# Patient Record
Sex: Female | Born: 2000 | Race: Black or African American | Hispanic: No | Marital: Single | Smoking: Never smoker
Health system: Southern US, Community
[De-identification: ages and names within clinical notes are randomized; demographics above are authoritative.]

## PROBLEM LIST (undated history)

## (undated) DIAGNOSIS — J45909 Unspecified asthma, uncomplicated: Secondary | ICD-10-CM

## (undated) DIAGNOSIS — A64 Unspecified sexually transmitted disease: Secondary | ICD-10-CM

## (undated) DIAGNOSIS — Z5189 Encounter for other specified aftercare: Secondary | ICD-10-CM

## (undated) HISTORY — DX: Encounter for other specified aftercare: Z51.89

## (undated) HISTORY — PX: NO PAST SURGERIES: SHX2092

## (undated) HISTORY — DX: Unspecified sexually transmitted disease: A64

---
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
## 2018-05-11 ENCOUNTER — Other Ambulatory Visit: Payer: Self-pay | Admitting: Physician Assistant

## 2018-05-11 DIAGNOSIS — N644 Mastodynia: Secondary | ICD-10-CM

## 2018-05-23 ENCOUNTER — Other Ambulatory Visit: Payer: Self-pay

## 2018-08-13 ENCOUNTER — Other Ambulatory Visit: Payer: Self-pay

## 2018-08-13 ENCOUNTER — Emergency Department (HOSPITAL_COMMUNITY): Payer: Self-pay

## 2018-08-13 ENCOUNTER — Emergency Department (HOSPITAL_COMMUNITY)
Admission: EM | Admit: 2018-08-13 | Discharge: 2018-08-13 | Disposition: A | Discharge status: Home / Self Care | Payer: Self-pay | Attending: Emergency Medicine | Admitting: Emergency Medicine

## 2018-08-13 ENCOUNTER — Encounter (HOSPITAL_COMMUNITY): Payer: Self-pay

## 2018-08-13 DIAGNOSIS — R079 Chest pain, unspecified: Secondary | ICD-10-CM | POA: Insufficient documentation

## 2018-08-13 DIAGNOSIS — R1084 Generalized abdominal pain: Secondary | ICD-10-CM

## 2018-08-13 DIAGNOSIS — J45909 Unspecified asthma, uncomplicated: Secondary | ICD-10-CM | POA: Insufficient documentation

## 2018-08-13 LAB — URINALYSIS, ROUTINE W REFLEX MICROSCOPIC
Bilirubin Urine: NEGATIVE
GLUCOSE, UA: NEGATIVE mg/dL
Hgb urine dipstick: NEGATIVE
Ketones, ur: 80 mg/dL — AB
LEUKOCYTE UA: NEGATIVE
Nitrite: NEGATIVE
PH: 5 (ref 5.0–8.0)
Protein, ur: NEGATIVE mg/dL
SPECIFIC GRAVITY, URINE: 1.027 (ref 1.005–1.030)

## 2018-08-13 LAB — COMPREHENSIVE METABOLIC PANEL
ALBUMIN: 3.9 g/dL (ref 3.5–5.0)
ALT: 12 U/L (ref 0–44)
AST: 19 U/L (ref 15–41)
Alkaline Phosphatase: 85 U/L (ref 38–126)
Anion gap: 12 (ref 5–15)
BUN: 6 mg/dL (ref 6–20)
CALCIUM: 9.3 mg/dL (ref 8.9–10.3)
CO2: 21 mmol/L — AB (ref 22–32)
CREATININE: 0.79 mg/dL (ref 0.44–1.00)
Chloride: 101 mmol/L (ref 98–111)
GFR calc Af Amer: >60 mL/min (ref >60)
GFR calc non Af Amer: >60 mL/min (ref >60)
GLUCOSE: 85 mg/dL (ref 70–99)
Potassium: 3.7 mmol/L (ref 3.5–5.1)
SODIUM: 134 mmol/L — AB (ref 135–145)
Total Bilirubin: 0.6 mg/dL (ref 0.3–1.2)
Total Protein: 7.7 g/dL (ref 6.5–8.1)

## 2018-08-13 LAB — CBC WITH DIFFERENTIAL/PLATELET
ABS IMMATURE GRANULOCYTES: 0.06 10*3/uL (ref 0.00–0.07)
BASOS ABS: 0.1 10*3/uL (ref 0.0–0.1)
Basophils Relative: 0 %
Eosinophils Absolute: 0 10*3/uL (ref 0.0–0.5)
Eosinophils Relative: 0 %
HEMATOCRIT: 39.1 % (ref 36.0–46.0)
Hemoglobin: 12.4 g/dL (ref 12.0–15.0)
Immature Granulocytes: 0 %
Lymphocytes Relative: 10 %
Lymphs Abs: 1.5 10*3/uL (ref 0.7–4.0)
MCH: 22.9 pg — ABNORMAL LOW (ref 26.0–34.0)
MCHC: 31.7 g/dL (ref 30.0–36.0)
MCV: 72.1 fL — ABNORMAL LOW (ref 80.0–100.0)
Monocytes Absolute: 1.6 10*3/uL — ABNORMAL HIGH (ref 0.1–1.0)
Monocytes Relative: 11 %
NEUTROS PCT: 79 %
Neutro Abs: 11.8 10*3/uL — ABNORMAL HIGH (ref 1.7–7.7)
Platelets: 311 10*3/uL (ref 150–400)
RBC: 5.42 MIL/uL — AB (ref 3.87–5.11)
RDW: 17.6 % — ABNORMAL HIGH (ref 11.5–15.5)
WBC: 15.1 10*3/uL — ABNORMAL HIGH (ref 4.0–10.5)
nRBC: 0 % (ref 0.0–0.2)

## 2018-08-13 LAB — I-STAT BETA HCG BLOOD, ED (MC, WL, AP ONLY): I-stat hCG, quantitative: <5 m[IU]/mL (ref <5)

## 2018-08-13 LAB — LIPASE, BLOOD: Lipase: 43 U/L (ref 11–51)

## 2018-08-13 MED ORDER — ONDANSETRON 4 MG PO TBDP: 4.0000 mg | ORAL_TABLET | Freq: Three times a day (TID) | ORAL | 0 refills | Status: DC | PRN

## 2018-08-13 MED ORDER — MORPHINE SULFATE (PF) 4 MG/ML IV SOLN
4.0000 mg | Freq: Once | INTRAVENOUS | Status: AC
  Administered 2018-08-13: 4 mg via INTRAVENOUS
  Filled 2018-08-13: qty 1

## 2018-08-13 MED ORDER — SODIUM CHLORIDE 0.9 % IV BOLUS
1000.0000 mL | Freq: Once | INTRAVENOUS | Status: AC
  Administered 2018-08-13: 1000 mL via INTRAVENOUS

## 2018-08-13 MED ORDER — IOHEXOL 300 MG/ML  SOLN
100.0000 mL | Freq: Once | INTRAMUSCULAR | Status: AC | PRN
  Administered 2018-08-13: 100 mL via INTRAVENOUS

## 2018-08-13 MED ORDER — HYDROMORPHONE HCL 1 MG/ML IJ SOLN
0.5000 mg | Freq: Once | INTRAMUSCULAR | Status: AC
  Administered 2018-08-13: 0.5 mg via INTRAVENOUS
  Filled 2018-08-13: qty 1

## 2018-08-13 MED ORDER — OXYCODONE-ACETAMINOPHEN 5-325 MG PO TABS
1.0000 | ORAL_TABLET | Freq: Once | ORAL | Status: AC
  Administered 2018-08-13: 1 via ORAL
  Filled 2018-08-13: qty 1

## 2018-08-13 MED ORDER — DICYCLOMINE HCL 20 MG PO TABS: 20.0000 mg | ORAL_TABLET | Freq: Two times a day (BID) | ORAL | 0 refills | Status: DC

## 2018-08-13 MED ORDER — ONDANSETRON HCL 4 MG/2ML IJ SOLN
4.0000 mg | Freq: Once | INTRAMUSCULAR | Status: AC
  Administered 2018-08-13: 4 mg via INTRAVENOUS
  Filled 2018-08-13: qty 2

## 2018-08-13 NOTE — ED Notes (Signed)
Pt given water for fluid challenge 

## 2018-08-13 NOTE — ED Triage Notes (Signed)
Pt arrived with c/o abd pn x couple days; pt stated that she is approaching menstrual cycle time but the cramping felt different. Pt states that he chest pain started yesterday, centralized and pn moves between abd and chest.

## 2018-08-13 NOTE — ED Provider Notes (Signed)
[facility] EMERGENCY DEPARTMENT Provider Note   CSN: [id number] Arrival date & time: 08/13/18  0608    History   Chief Complaint Chief Complaint  Patient presents with  . Abdominal Pain    x couple days  . Chest Pain    yesterday    HPI Shari Flores is a 18 y.o. female.     Patient with no past surgical history presents the emergency department with 3 days of abdominal pain.  Pain started in the lower abdomen and has now moved up to the upper abdomen including the chest.  Patient has had a mild sore throat with hot flashes and chills.  No fevers reported.  No runny nose or cough  She vomited once this morning when the pain was worse.  No diarrhea or urinary symptoms.  She states that she is anticipating her menstrual period starting soon.  No vaginal bleeding or discharge.  No significant shortness of breath.  No treatments prior to arrival.  No known COVID or sick contacts.   Shari Flores was evaluated in Emergency Department on 08/13/2018 for the symptoms described in the history of present illness. She was evaluated in the context of the global COVID-19 pandemic, which necessitated consideration that the patient might be at risk for infection with the SARS-CoV-2 virus that causes COVID-19. Institutional protocols and algorithms that pertain to the evaluation of patients at risk for COVID-19 are in a state of rapid change based on information released by regulatory bodies including the CDC and federal and state organizations. These policies and algorithms were followed during the patient's care in the ED.      Past Medical History:  Diagnosis Date  . Asthma     Patient Active Problem List   Diagnosis Date Noted  . Mild persistent chronic asthma without complication 04/30/2014    History reviewed. No pertinent surgical history.   OB History   No obstetric history on file.      Home Medications    Prior to Admission medications    Medication Sig Start Date End Date Taking? Authorizing Provider  loratadine (CLARITIN) 10 MG tablet Take 10 mg by mouth daily as needed for allergies.    [provider]  mometasone (ASMANEX) 220 MCG/INH inhaler Inhale 1 puff into the lungs at bedtime. 05/01/15   Nyoka Cowden, MD  PROAIR HFA 108 [phone] BASE) MCG/ACT inhaler As needed 03/28/14   [provider]    Family History Family History  Problem Relation Age of Onset  . Diabetes Maternal Grandfather     Social History Social History   Tobacco Use  . Smoking status: Never Smoker  Substance Use Topics  . Alcohol use: Not on file  . Drug use: No     Allergies   Mobic [meloxicam]   Review of Systems Review of Systems  Constitutional: Positive for chills. Negative for fever.  HENT: Positive for sore throat. Negative for rhinorrhea.   Eyes: Negative for redness.  Respiratory: Negative for cough and shortness of breath.   Cardiovascular: Negative for chest pain.  Gastrointestinal: Positive for abdominal pain, nausea and vomiting. Negative for diarrhea.  Genitourinary: Negative for dysuria.  Musculoskeletal: Negative for myalgias.  Skin: Negative for rash.  Neurological: Negative for headaches.     Physical Exam Updated Vital Signs BP 136/85   Pulse 95   Temp 98.7 F (37.1 C) (Oral)   Resp (!) 23   Ht 5\' 4"  (1.626 m)   Wt 81.6 kg  LMP 07/15/2018   SpO2 99%   BMI 30.90 kg/m   Physical Exam Vitals signs and nursing note reviewed.  Constitutional:      Appearance: She is well-developed.  HENT:     Head: Normocephalic and atraumatic.  Eyes:     General:        Right eye: No discharge.        Left eye: No discharge.     Conjunctiva/sclera: Conjunctivae normal.  Neck:     Musculoskeletal: Normal range of motion and neck supple.  Cardiovascular:     Rate and Rhythm: Normal rate and regular rhythm.     Heart sounds: Normal heart sounds.  Pulmonary:     Effort: Pulmonary effort is  normal.     Breath sounds: Normal breath sounds.  Abdominal:     Palpations: Abdomen is soft.     Tenderness: There is generalized abdominal tenderness.    Skin:    General: Skin is warm and dry.  Neurological:     Mental Status: She is alert.      ED Treatments / Results  Labs (all labs ordered are listed, but only abnormal results are displayed) Labs Reviewed  CBC WITH DIFFERENTIAL/PLATELET - Abnormal; Notable for the following components:      Result Value   WBC 15.1 (*)    RBC 5.42 (*)    MCV 72.1 (*)    MCH 22.9 (*)    RDW 17.6 (*)    Neutro Abs 11.8 (*)    Monocytes Absolute 1.6 (*)    All other components within normal limits  COMPREHENSIVE METABOLIC PANEL - Abnormal; Notable for the following components:   Sodium 134 (*)    CO2 21 (*)    All other components within normal limits  URINALYSIS, ROUTINE W REFLEX MICROSCOPIC - Abnormal; Notable for the following components:   APPearance HAZY (*)    Ketones, ur 80 (*)    All other components within normal limits  LIPASE, BLOOD  I-STAT BETA HCG BLOOD, ED ([facility], [facility], [facility] ONLY)    EKG None  Radiology Dg Chest 2 View  Result Date: 08/13/2018 CLINICAL DATA:  Abdominal pain.  Chest pain. EXAM: CHEST - 2 VIEW COMPARISON:  None. FINDINGS: Numerous leads and wires project over the chest. Midline trachea. Normal heart size and mediastinal contours. No pleural effusion or pneumothorax. Clear lungs. IMPRESSION: Normal chest. Electronically Signed   By: Jeronimo Greaves M.D.   On: 08/13/2018 07:17    Procedures Procedures (including critical care time)  Medications Ordered in ED Medications  HYDROmorphone (DILAUDID) injection 0.5 mg (has no administration in time range)  morphine 4 MG/ML injection 4 mg (4 mg Intravenous Given 08/13/18 0643)  ondansetron (ZOFRAN) injection 4 mg (4 mg Intravenous Given 08/13/18 6195)     Initial Impression / Assessment and Plan / ED Course  I have reviewed the triage vital signs and the nursing  notes.  Pertinent labs & imaging results that were available during my care of the patient were reviewed by me and considered in my medical decision making (see chart for details).        Patient seen and examined. Work-up initiated. Medications ordered.   Vital signs reviewed and are as follows: BP 136/85   Pulse 95   Temp 98.7 F (37.1 C) (Oral)   Resp (!) 23   Ht 5\' 4"  (1.626 m)   Wt 81.6 kg   LMP 07/15/2018   SpO2 99%   BMI 30.90 kg/m  7:21 AM CXR images personally reviewed.   8:13 AM lab work-up significant for leukocytosis.  Patient is not pregnant.  On reexam, she is curled up in bed and appears very uncomfortable.  Additional pain medication ordered.  Given elevated white blood cell count and abdominal pain, will proceed with CT imaging of the abdomen to rule out appendicitis or other abdominal infection.  11:20 AM CT images personally reviewed earlier.   These were negative. Pt updated on all results.  Her pain is better controlled.  We will give a liter of normal saline and have patient drink fluids.  She has tolerated p.o. challenge, appears well.  On reexam, abdomen minimally generally tender.  We will discharged home with Bentyl and Zofran.  Encouraged OTC meds for sore throat and for abdominal pain.  The patient was urged to return to the Emergency Department immediately with worsening of current symptoms, worsening abdominal pain, persistent vomiting, blood noted in stools, fever, or any other concerns. The patient verbalized understanding.    Final Clinical Impressions(s) / ED Diagnoses   Final diagnoses:  Generalized abdominal pain   Patient with abdominal pain. Neg pregnancy. Vitals are stable, no fever. Labs with elevated WBC, otherwise reassuring. Imaging no acute findings in abd. Mild signs of dehydration treated with IVF, patient is tolerating PO's. Lungs are clear and no signs suggestive of PNA. Low concern for appendicitis, cholecystitis,  pancreatitis, ruptured viscus, UTI, kidney stone, aortic dissection, aortic aneurysm or other emergent abdominal etiology. Supportive therapy indicated with return if symptoms worsen.    ED Discharge Orders         Ordered    dicyclomine (BENTYL) 20 MG tablet  2 times daily     08/13/18 1112    ondansetron (ZOFRAN ODT) 4 MG disintegrating tablet  Every 8 hours PRN     08/13/18 1113           Renne Crigler, PA-C 08/13/18 1122    Pricilla Loveless, MD 08/13/18 1539

## 2018-08-13 NOTE — ED Notes (Signed)
Patient transported to CT 

## 2018-08-13 NOTE — Discharge Instructions (Signed)
Please read and follow all provided instructions.  Your diagnoses today include:  1. Generalized abdominal pain     Tests performed today include:  Blood counts and electrolytes  Blood tests to check liver and kidney function  Blood tests to check pancreas function  Urine test to look for infection and pregnancy (in women) - negative  CT - does not show any concerning causes for your abdominal pain today  Vital signs. See below for your results today.   Medications prescribed:   Bentyl - medication for intestinal cramps and spasms   Naproxen - anti-inflammatory pain medication  Do not exceed 500mg  naproxen every 12 hours, take with food  You have been prescribed an anti-inflammatory medication or NSAID. Take with food. Take smallest effective dose for the shortest duration needed for your pain. Stop taking if you experience stomach pain or vomiting.   Take any prescribed medications only as directed.  Home care instructions:   Follow any educational materials contained in this packet.  Follow-up instructions: Please follow-up with your primary care provider in the next 3 days for further evaluation of your symptoms.    Return instructions:  SEEK IMMEDIATE MEDICAL ATTENTION IF:  The pain does not go away or becomes severe   A temperature above 101F develops   Repeated vomiting occurs (multiple episodes)   The pain becomes localized to portions of the abdomen. The right side could possibly be appendicitis. In an adult, the left lower portion of the abdomen could be colitis or diverticulitis.   Blood is being passed in stools or vomit (bright red or black tarry stools)   You develop chest pain, difficulty breathing, dizziness or fainting, or become confused, poorly responsive, or inconsolable (young children)  If you have any other emergent concerns regarding your health  Additional Information: Abdominal (belly) pain can be caused by many things. Your caregiver  performed an examination and possibly ordered blood/urine tests and imaging (CT scan, x-rays, ultrasound). Many cases can be observed and treated at home after initial evaluation in the emergency department. Even though you are being discharged home, abdominal pain can be unpredictable. Therefore, you need a repeated exam if your pain does not resolve, returns, or worsens. Most patients with abdominal pain don't have to be admitted to the hospital or have surgery, but serious problems like appendicitis and gallbladder attacks can start out as nonspecific pain. Many abdominal conditions cannot be diagnosed in one visit, so follow-up evaluations are very important.  Your vital signs today were: BP 114/78    Pulse 93    Temp 98.7 F (37.1 C) (Oral)    Resp 20    Ht 5\' 4"  (1.626 m)    Wt 81.6 kg    LMP 07/15/2018    SpO2 100%    BMI 30.90 kg/m  If your blood pressure (bp) was elevated above 135/85 this visit, please have this repeated by your doctor within one month. --------------

## 2018-08-14 ENCOUNTER — Emergency Department (HOSPITAL_COMMUNITY)
Admission: EM | Admit: 2018-08-14 | Discharge: 2018-08-14 | Disposition: A | Discharge status: Home / Self Care | Payer: Self-pay | Attending: Emergency Medicine | Admitting: Emergency Medicine

## 2018-08-14 ENCOUNTER — Encounter (HOSPITAL_COMMUNITY): Payer: Self-pay | Admitting: Emergency Medicine

## 2018-08-14 ENCOUNTER — Other Ambulatory Visit: Payer: Self-pay

## 2018-08-14 ENCOUNTER — Emergency Department (HOSPITAL_COMMUNITY): Payer: Self-pay

## 2018-08-14 DIAGNOSIS — R1084 Generalized abdominal pain: Secondary | ICD-10-CM | POA: Insufficient documentation

## 2018-08-14 DIAGNOSIS — Z79899 Other long term (current) drug therapy: Secondary | ICD-10-CM | POA: Insufficient documentation

## 2018-08-14 DIAGNOSIS — J029 Acute pharyngitis, unspecified: Secondary | ICD-10-CM | POA: Insufficient documentation

## 2018-08-14 DIAGNOSIS — J4530 Mild persistent asthma, uncomplicated: Secondary | ICD-10-CM | POA: Insufficient documentation

## 2018-08-14 DIAGNOSIS — R112 Nausea with vomiting, unspecified: Secondary | ICD-10-CM | POA: Insufficient documentation

## 2018-08-14 LAB — CBC WITH DIFFERENTIAL/PLATELET
Abs Immature Granulocytes: 0.06 10*3/uL (ref 0.00–0.07)
BASOS ABS: 0.1 10*3/uL (ref 0.0–0.1)
BASOS PCT: 0 %
Eosinophils Absolute: 0 10*3/uL (ref 0.0–0.5)
Eosinophils Relative: 0 %
HCT: 39.9 % (ref 36.0–46.0)
Hemoglobin: 12.3 g/dL (ref 12.0–15.0)
Immature Granulocytes: 0 %
LYMPHS PCT: 18 %
Lymphs Abs: 3.1 10*3/uL (ref 0.7–4.0)
MCH: 22.3 pg — AB (ref 26.0–34.0)
MCHC: 30.8 g/dL (ref 30.0–36.0)
MCV: 72.3 fL — ABNORMAL LOW (ref 80.0–100.0)
MONO ABS: 2.2 10*3/uL — AB (ref 0.1–1.0)
Monocytes Relative: 13 %
NRBC: 0 % (ref 0.0–0.2)
Neutro Abs: 11.6 10*3/uL — ABNORMAL HIGH (ref 1.7–7.7)
Neutrophils Relative %: 69 %
PLATELETS: 304 10*3/uL (ref 150–400)
RBC: 5.52 MIL/uL — AB (ref 3.87–5.11)
RDW: 17.7 % — ABNORMAL HIGH (ref 11.5–15.5)
WBC: 17 10*3/uL — AB (ref 4.0–10.5)

## 2018-08-14 LAB — COMPREHENSIVE METABOLIC PANEL
ALT: 11 U/L (ref 0–44)
ANION GAP: 11 (ref 5–15)
AST: 18 U/L (ref 15–41)
Albumin: 4.1 g/dL (ref 3.5–5.0)
Alkaline Phosphatase: 83 U/L (ref 38–126)
BUN: 6 mg/dL (ref 6–20)
CO2: 21 mmol/L — AB (ref 22–32)
CREATININE: 0.77 mg/dL (ref 0.44–1.00)
Calcium: 9.3 mg/dL (ref 8.9–10.3)
Chloride: 103 mmol/L (ref 98–111)
Glucose, Bld: 85 mg/dL (ref 70–99)
Potassium: 4 mmol/L (ref 3.5–5.1)
SODIUM: 135 mmol/L (ref 135–145)
Total Bilirubin: 0.8 mg/dL (ref 0.3–1.2)
Total Protein: 8.2 g/dL — ABNORMAL HIGH (ref 6.5–8.1)

## 2018-08-14 LAB — GROUP A STREP BY PCR: Group A Strep by PCR: NOT DETECTED

## 2018-08-14 LAB — LIPASE, BLOOD: LIPASE: 28 U/L (ref 11–51)

## 2018-08-14 MED ORDER — MORPHINE SULFATE (PF) 2 MG/ML IV SOLN
2.0000 mg | Freq: Once | INTRAVENOUS | Status: DC
  Filled 2018-08-14: qty 1

## 2018-08-14 MED ORDER — SODIUM CHLORIDE 0.9 % IV BOLUS
1000.0000 mL | Freq: Once | INTRAVENOUS | Status: AC
  Administered 2018-08-14: 1000 mL via INTRAVENOUS

## 2018-08-14 MED ORDER — ONDANSETRON HCL 4 MG/2ML IJ SOLN
4.0000 mg | Freq: Once | INTRAMUSCULAR | Status: DC
  Filled 2018-08-14: qty 2

## 2018-08-14 MED ORDER — PROMETHAZINE HCL 25 MG PO TABS: 25.0000 mg | ORAL_TABLET | Freq: Four times a day (QID) | ORAL | 0 refills | Status: DC | PRN

## 2018-08-14 MED ORDER — KETOROLAC TROMETHAMINE 15 MG/ML IJ SOLN
15.0000 mg | Freq: Once | INTRAMUSCULAR | Status: AC
  Administered 2018-08-14: 15 mg via INTRAVENOUS
  Filled 2018-08-14: qty 1

## 2018-08-14 NOTE — ED Provider Notes (Signed)
[facility] EMERGENCY DEPARTMENT Provider Note   CSN: [id number] Arrival date & time: 08/14/18  0353    History   Chief Complaint Chief Complaint  Patient presents with  . Abdominal Pain  . Influenza    HPI Shari Flores is a 18 y.o. female with PMHx asthma who presents to the ED complaining of gradual onset, constant, waxing and waning, dull/achy, 4/10 at best, 10/10 at worst, epigastric abdominal pain x 3 days. Pt also complains of nausea and vomiting. She reports 2 episodes of bilious vomiting with blood tinged streaks. No coffee ground emesis. She also endorses fever with Tmax 100.4, sore throat, and frontal sinus pressure. She has been taking Tylenol Cold and Flu for fever, last taken at 9 PM (approximately 8 hours ago). Pt was seen in the ED yesterday for same; had elevated leukocytosis of 15.1 with negative CXR and CT A/P. Pt tolerated PO challenge in the ED and was discharged home with Bentyl and Zofran. She reports she was able to pick up the Bentyl but could not afford the Zofran and felt like she did not need it as she had only had 1 episode of vomiting at that time. Pt returns tonight for continued pain and another episode of vomiting. Reports that she has been around a friend < 2 weeks ago who had travelled to [place] for spring break. Denies diarrhea, constipation, urinary frequency, urgency, dysuria, hematuria, pelvic pain, vaginal discharge, difficulty swallowing, vision changes, cough, SOB.        Past Medical History:  Diagnosis Date  . Asthma     Patient Active Problem List   Diagnosis Date Noted  . Mild persistent chronic asthma without complication 04/30/2014    History reviewed. No pertinent surgical history.   OB History   No obstetric history on file.      Home Medications    Prior to Admission medications   Medication Sig Start Date End Date Taking? Authorizing Provider  dicyclomine (BENTYL) 20 MG tablet Take 1 tablet (20 mg  total) by mouth 2 (two) times daily. 08/13/18   Renne Crigler, PA-C  loratadine (CLARITIN) 10 MG tablet Take 10 mg by mouth daily as needed for allergies.    [provider]  mometasone (ASMANEX) 220 MCG/INH inhaler Inhale 1 puff into the lungs at bedtime. 05/01/15   Nyoka Cowden, MD  ondansetron (ZOFRAN ODT) 4 MG disintegrating tablet Take 1 tablet (4 mg total) by mouth every 8 (eight) hours as needed for nausea or vomiting. 08/13/18   Desmond Dike  PROAIR HFA 108 (90 BASE) MCG/ACT inhaler As needed 03/28/14   [provider]    Family History Family History  Problem Relation Age of Onset  . Diabetes Maternal Grandfather     Social History Social History   Tobacco Use  . Smoking status: Never Smoker  . Smokeless tobacco: Never Used  Substance Use Topics  . Alcohol use: Never    Alcohol/week: 0.0 standard drinks    Frequency: Never  . Drug use: No     Allergies   Mobic [meloxicam]   Review of Systems Review of Systems  Constitutional: Positive for chills and fever.  HENT: Positive for sinus pressure and sore throat. Negative for ear pain, trouble swallowing and voice change.   Eyes: Negative for visual disturbance.  Respiratory: Negative for cough and shortness of breath.   Cardiovascular: Negative for chest pain.  Gastrointestinal: Positive for abdominal pain, nausea and vomiting. Negative for blood in stool,  constipation and diarrhea.  Genitourinary: Negative for dysuria, flank pain, frequency, hematuria, pelvic pain and vaginal discharge.  Musculoskeletal: Positive for myalgias.  Skin: Negative for rash.  Neurological: Negative for syncope.     Physical Exam Updated Vital Signs BP (!) 125/96   Pulse 81   Temp 98.5 F (36.9 C) (Oral)   Resp 16   Ht 5\' 4"  (1.626 m)   Wt 81.6 kg   LMP 07/15/2018   SpO2 100%   BMI 30.88 kg/m   Physical Exam Vitals signs and nursing note reviewed.  Constitutional:      Appearance: She is  ill-appearing.  HENT:     Head: Normocephalic and atraumatic.     Mouth/Throat:     Mouth: Mucous membranes are moist.     Pharynx: Pharyngeal swelling and oropharyngeal exudate present.     Comments: 1+ bilateral tonsillar hypertrophy with exudate on right tonsil Eyes:     Conjunctiva/sclera: Conjunctivae normal.     Pupils: Pupils are equal, round, and reactive to light.  Neck:     Musculoskeletal: Neck supple.  Cardiovascular:     Rate and Rhythm: Normal rate and regular rhythm.  Pulmonary:     Effort: Pulmonary effort is normal.     Breath sounds: Normal breath sounds. No wheezing, rhonchi or rales.  Abdominal:     General: Abdomen is flat. Bowel sounds are normal.     Palpations: Abdomen is soft.     Tenderness: There is abdominal tenderness in the epigastric area. There is no right CVA tenderness, left CVA tenderness, guarding or rebound.  Skin:    General: Skin is warm and dry.  Neurological:     Mental Status: She is alert.      ED Treatments / Results  Labs (all labs ordered are listed, but only abnormal results are displayed) Labs Reviewed  GROUP A STREP BY PCR  COMPREHENSIVE METABOLIC PANEL  LIPASE, BLOOD  CBC WITH DIFFERENTIAL/PLATELET  URINALYSIS, ROUTINE W REFLEX MICROSCOPIC    EKG EKG Interpretation  Date/Time:  Monday August 14 2018 05:40:09 EDT Ventricular Rate:  71 PR Interval:    QRS Duration: 104 QT Interval:  386 QTC Calculation: 420 R Axis:   104 Text Interpretation:  Sinus rhythm Borderline right axis deviation Borderline Q waves in inferior leads Borderline T abnormalities, anterior leads No old tracing to compare Confirmed by Dione Booze ([id number]) on 08/14/2018 5:48:57 AM   Radiology Dg Chest 2 View  Result Date: 08/13/2018 CLINICAL DATA:  Abdominal pain.  Chest pain. EXAM: CHEST - 2 VIEW COMPARISON:  None. FINDINGS: Numerous leads and wires project over the chest. Midline trachea. Normal heart size and mediastinal contours. No pleural  effusion or pneumothorax. Clear lungs. IMPRESSION: Normal chest. Electronically Signed   By: Jeronimo Greaves M.D.   On: 08/13/2018 07:17   Ct Abdomen Pelvis W Contrast  Result Date: 08/13/2018 CLINICAL DATA:  Acute generalized abdominal pain. EXAM: CT ABDOMEN AND PELVIS WITH CONTRAST TECHNIQUE: Multidetector CT imaging of the abdomen and pelvis was performed using the standard protocol following bolus administration of intravenous contrast. CONTRAST:  OMNIPAQUE IOHEXOL 300 MG/ML  SOLN COMPARISON:  None. FINDINGS: Lower chest: No acute abnormality. Hepatobiliary: No focal liver abnormality is seen. No gallstones, gallbladder wall thickening, or biliary dilatation. Pancreas: Unremarkable. No pancreatic ductal dilatation or surrounding inflammatory changes. Spleen: Normal in size without focal abnormality. Adrenals/Urinary Tract: Adrenal glands are unremarkable. Kidneys are normal, without renal calculi, focal lesion, or hydronephrosis. Bladder is unremarkable. Stomach/Bowel: Stomach  is within normal limits. Appendix appears normal. No evidence of bowel wall thickening, distention, or inflammatory changes. Vascular/Lymphatic: No significant vascular findings are present. No enlarged abdominal or pelvic lymph nodes. Reproductive: Uterus and bilateral adnexa are unremarkable. Other: No abdominal wall hernia or abnormality. No abdominopelvic ascites. Musculoskeletal: No acute or significant osseous findings. IMPRESSION: No definite abnormality seen in the abdomen or pelvis. Electronically Signed   By: Lupita Raider, M.D.   On: 08/13/2018 09:17    Procedures Procedures (including critical care time)  Medications Ordered in ED Medications  ondansetron (ZOFRAN) injection 4 mg (has no administration in time range)  morphine 2 MG/ML injection 2 mg (has no administration in time range)  sodium chloride 0.9 % bolus 1,000 mL (1,000 mLs Intravenous New Bag/Given 08/14/18 0536)     Initial Impression /  Assessment and Plan / ED Course  I have reviewed the triage vital signs and the nursing notes.  Pertinent labs & imaging results that were available during my care of the patient were reviewed by me and considered in my medical decision making (see chart for details).    Epigastric abdominal pain, fever, sore throat. Pt afebrile in the ED. Seen in the ED yesterday for same with leukocytosis but negative remaining workup including CXR and CT A/P. Did not get strep test yesterday; CENTOR criteria score of 4; will check today. Repeat blood work to check for any acute changes from yesterday. If elevated LFTs could consider RUQ ultrasound. Acute abdominal series ordered to check for free air. Will PO challenge at end of visit with likely dispo home with Rx Carafate and phenergan. Pt declined pain medication during ED visit; will continue to assess.   At shift change case signed out to Rhea Bleacher, PA-C who will evaluate labs and imaging and dispo patient accordingly.        Final Clinical Impressions(s) / ED Diagnoses   Final diagnoses:  None    ED Discharge Orders    None       Tanda Rockers, PA-C 08/14/18 9147    Dione Booze, MD 08/14/18 [phone]

## 2018-08-14 NOTE — Discharge Instructions (Signed)
Please read and follow all provided instructions.  Your diagnoses today include:  1. Generalized abdominal pain   2. Non-intractable vomiting with nausea, unspecified vomiting type   3. Sore throat     Tests performed today include:  Blood counts and electrolytes  Blood tests to check liver and kidney function  Blood tests to check pancreas function  Strep test - negative  Chest x-ray and abdominal x-ray - normal appearing  Vital signs. See below for your results today.   Medications prescribed:   Phenergan (promethazine) - for nausea and vomiting  Take any prescribed medications only as directed.  Home care instructions:   Follow any educational materials contained in this packet.  Follow-up instructions: Please follow-up with your primary care provider in the next 3 days for further evaluation of your symptoms if you aren't feeling better.    Return instructions:  SEEK IMMEDIATE MEDICAL ATTENTION IF:  The pain does not go away or becomes severe   A temperature above 101F develops   Repeated vomiting occurs (multiple episodes)   The pain becomes localized to portions of the abdomen. The right side could possibly be appendicitis. In an adult, the left lower portion of the abdomen could be colitis or diverticulitis.   Blood is being passed in stools or vomit (bright red or black tarry stools)   You develop chest pain, difficulty breathing, dizziness or fainting, or become confused, poorly responsive, or inconsolable (young children)  If you have any other emergent concerns regarding your health  Additional Information: Abdominal (belly) pain can be caused by many things. Your caregiver performed an examination and possibly ordered blood/urine tests and imaging (CT scan, x-rays, ultrasound). Many cases can be observed and treated at home after initial evaluation in the emergency department. Even though you are being discharged home, abdominal pain can be  unpredictable. Therefore, you need a repeated exam if your pain does not resolve, returns, or worsens. Most patients with abdominal pain don't have to be admitted to the hospital or have surgery, but serious problems like appendicitis and gallbladder attacks can start out as nonspecific pain. Many abdominal conditions cannot be diagnosed in one visit, so follow-up evaluations are very important.  Your vital signs today were: BP 127/81    Pulse 68    Temp 98 F (36.7 C) (Oral)    Resp 17    Ht 5\' 4"  (1.626 m)    Wt 81.6 kg    LMP 07/15/2018    SpO2 100%    BMI 30.88 kg/m  If your blood pressure (bp) was elevated above 135/85 this visit, please have this repeated by your doctor within one month. --------------

## 2018-08-14 NOTE — ED Notes (Signed)
Pt. Refused morphine and zofran. Pt. States no nausea and no pain at this time.

## 2018-08-14 NOTE — ED Notes (Signed)
Josh PA at [employer]

## 2018-08-14 NOTE — ED Triage Notes (Signed)
Reports being seen yesterday for abdominal pain as well as URI symptoms with fever max of 100.4.  Sent home with bentyl and zofran prescription.  Filled bentyl but not zofran.  Reports vomiting blood X 2.

## 2018-08-14 NOTE — ED Provider Notes (Signed)
7:49 AM handoff from [facility] at shift change.   Patient with vomiting, low-grade fever (T-max 100.4 F at home last night).  She continues to have abdominal pain and some chest pain.  She did start her menstruation.  Patient was seen by myself yesterday and had reassuring lab work and negative CT of the abdomen and pelvis.  She is not pregnant.  Repeat evaluation today shows normal chest and abdominal radiographs.  She continues to have a leukocytosis.  Normal liver, kidney, pancreas function.  She is drinking water in the room.  She appears comfortable.  Current complaint is sore throat and headache.  Patient informed of all results including negative strep test.  Will give Toradol.  Patient has reported allergy to Mobic.  This was a sensation of shortness of breath.  It was not associated with anaphylaxis.  She is able to take ibuprofen without any difficulty.  Will give 15 mg IV toradol and reassess.     Patient reassessed.  She is ready for discharge.  Questions answered.  Home with Phenergan for nausea (Zofran rx yesterday was too expensive).   The patient was urged to return to the Emergency Department immediately with worsening of current symptoms, worsening abdominal pain, persistent vomiting, blood noted in stools, fever, or any other concerns. The patient verbalized understanding.   BP 127/81 (BP Location: Right Arm)   Pulse 68   Temp 98 F (36.7 C) (Oral)   Resp 16   Ht 5\' 4"  (1.626 m)   Wt 81.6 kg   LMP 07/15/2018   SpO2 100%   BMI 30.88 kg/m      Renne Crigler, PA-C 08/14/18 1218    Dione Booze, MD 08/14/18 2244

## 2018-08-14 NOTE — ED Notes (Signed)
Patient transported to X-ray 

## 2019-03-24 ENCOUNTER — Other Ambulatory Visit: Payer: Self-pay

## 2019-03-24 DIAGNOSIS — Z20822 Contact with and (suspected) exposure to covid-19: Secondary | ICD-10-CM

## 2019-03-26 LAB — NOVEL CORONAVIRUS, NAA: SARS-CoV-2, NAA: NOT DETECTED

## 2019-03-27 ENCOUNTER — Telehealth: Payer: Self-pay | Admitting: Family Medicine

## 2019-03-27 NOTE — Telephone Encounter (Signed)
Negative COVID results given. Patient results "NOT Detected." Caller expressed understanding. ° °

## 2019-04-06 LAB — OB RESULTS CONSOLE GC/CHLAMYDIA
Chlamydia: NEGATIVE
Gonorrhea: NEGATIVE

## 2019-04-06 LAB — OB RESULTS CONSOLE ABO/RH: RH Type: NEGATIVE

## 2019-04-06 LAB — OB RESULTS CONSOLE RPR: RPR: NONREACTIVE

## 2019-04-06 LAB — OB RESULTS CONSOLE RUBELLA ANTIBODY, IGM: Rubella: IMMUNE

## 2019-04-06 LAB — OB RESULTS CONSOLE HIV ANTIBODY (ROUTINE TESTING): HIV: NONREACTIVE

## 2019-04-06 LAB — OB RESULTS CONSOLE ANTIBODY SCREEN: Antibody Screen: NEGATIVE

## 2019-04-06 LAB — OB RESULTS CONSOLE HEPATITIS B SURFACE ANTIGEN: Hepatitis B Surface Ag: NEGATIVE

## 2019-05-18 NOTE — L&D Delivery Note (Signed)
Delivery Note At 3:22 AM a viable female was delivered via Vaginal, Spontaneous (Presentation: LOA     ).  APGAR: 8,9 ; weight: pending  Placenta status: Spontaneous, Intact.  Cord:   with the following complications:  .  Cord pH: n/a  Anesthesia: Epidural Episiotomy:  none Lacerations:  Small 1st degree Suture Repair: 2.0 vicryl Est. Blood Loss (mL):  321cc  Noted to have low grade maternal temp and has been given Tylenol.  Plan for Unasyn x 1 for suspected intra-amniotic infection  Mom to postpartum.  Baby to Couplet care / Skin to Skin.  Sharon Seller 11/15/2019, 3:53 AM

## 2019-07-12 ENCOUNTER — Inpatient Hospital Stay (HOSPITAL_COMMUNITY)
Admission: AD | Admit: 2019-07-12 | Discharge: 2019-07-12 | Disposition: A | Discharge status: Home / Self Care | Payer: Medicaid Other | Attending: Obstetrics & Gynecology | Admitting: Obstetrics & Gynecology

## 2019-07-12 ENCOUNTER — Encounter (HOSPITAL_COMMUNITY): Payer: Self-pay | Admitting: Obstetrics & Gynecology

## 2019-07-12 ENCOUNTER — Other Ambulatory Visit: Payer: Self-pay

## 2019-07-12 ENCOUNTER — Inpatient Hospital Stay (HOSPITAL_COMMUNITY): Payer: Medicaid Other

## 2019-07-12 DIAGNOSIS — R1011 Right upper quadrant pain: Secondary | ICD-10-CM | POA: Diagnosis not present

## 2019-07-12 DIAGNOSIS — R0602 Shortness of breath: Secondary | ICD-10-CM | POA: Diagnosis present

## 2019-07-12 DIAGNOSIS — Z3A21 21 weeks gestation of pregnancy: Secondary | ICD-10-CM | POA: Insufficient documentation

## 2019-07-12 DIAGNOSIS — R109 Unspecified abdominal pain: Secondary | ICD-10-CM

## 2019-07-12 DIAGNOSIS — Z8709 Personal history of other diseases of the respiratory system: Secondary | ICD-10-CM

## 2019-07-12 DIAGNOSIS — O26892 Other specified pregnancy related conditions, second trimester: Secondary | ICD-10-CM

## 2019-07-12 LAB — CBC
HCT: 33.4 % — ABNORMAL LOW (ref 36.0–46.0)
Hemoglobin: 11.1 g/dL — ABNORMAL LOW (ref 12.0–15.0)
MCH: 25.3 pg — ABNORMAL LOW (ref 26.0–34.0)
MCHC: 33.2 g/dL (ref 30.0–36.0)
MCV: 76.1 fL — ABNORMAL LOW (ref 80.0–100.0)
Platelets: 250 10*3/uL (ref 150–400)
RBC: 4.39 MIL/uL (ref 3.87–5.11)
RDW: 15.2 % (ref 11.5–15.5)
WBC: 14 10*3/uL — ABNORMAL HIGH (ref 4.0–10.5)
nRBC: 0 % (ref 0.0–0.2)

## 2019-07-12 LAB — URINALYSIS, ROUTINE W REFLEX MICROSCOPIC
Bilirubin Urine: NEGATIVE
Glucose, UA: NEGATIVE mg/dL
Hgb urine dipstick: NEGATIVE
Ketones, ur: NEGATIVE mg/dL
Nitrite: NEGATIVE
Protein, ur: NEGATIVE mg/dL
Specific Gravity, Urine: 1.013 (ref 1.005–1.030)
pH: 8 (ref 5.0–8.0)

## 2019-07-12 LAB — COMPREHENSIVE METABOLIC PANEL
ALT: 12 U/L (ref 0–44)
AST: 15 U/L (ref 15–41)
Albumin: 2.9 g/dL — ABNORMAL LOW (ref 3.5–5.0)
Alkaline Phosphatase: 62 U/L (ref 38–126)
Anion gap: 11 (ref 5–15)
BUN: 7 mg/dL (ref 6–20)
CO2: 17 mmol/L — ABNORMAL LOW (ref 22–32)
Calcium: 8.7 mg/dL — ABNORMAL LOW (ref 8.9–10.3)
Chloride: 107 mmol/L (ref 98–111)
Creatinine, Ser: 0.55 mg/dL (ref 0.44–1.00)
GFR calc Af Amer: >60 mL/min (ref >60)
GFR calc non Af Amer: >60 mL/min (ref >60)
Glucose, Bld: 73 mg/dL (ref 70–99)
Potassium: 3.6 mmol/L (ref 3.5–5.1)
Sodium: 135 mmol/L (ref 135–145)
Total Bilirubin: 0.4 mg/dL (ref 0.3–1.2)
Total Protein: 6.2 g/dL — ABNORMAL LOW (ref 6.5–8.1)

## 2019-07-12 LAB — LIPASE, BLOOD: Lipase: 23 U/L (ref 11–51)

## 2019-07-12 MED ORDER — FAMOTIDINE 40 MG/5ML PO SUSR: 20.0000 mg | Freq: Once | ORAL | Status: DC

## 2019-07-12 MED ORDER — FAMOTIDINE 20 MG PO TABS
20.0000 mg | ORAL_TABLET | Freq: Once | ORAL | Status: AC
  Administered 2019-07-12: 20 mg via ORAL
  Filled 2019-07-12: qty 1

## 2019-07-12 MED ORDER — ONDANSETRON 4 MG PO TBDP
4.0000 mg | ORAL_TABLET | Freq: Once | ORAL | Status: AC
  Administered 2019-07-12: 4 mg via ORAL
  Filled 2019-07-12: qty 1

## 2019-07-12 NOTE — MAU Provider Note (Signed)
History     CSN: [id number]  Arrival date and time: 07/12/19 4193   First Provider Initiated Contact with Patient 07/12/19 1005      Chief Complaint  Patient presents with  . Shortness of Breath  . Abdominal Pain   Shari Flores is a 19 y.o. G1P0 at [redacted]w[redacted]d who receives care at [facility].  She presents today for Shortness of Breath and Abdominal Pain.  She states this morning around 0800 she went to throw up and felt like she couldn't "catch my breath."  She states this is a usual occurrence, but "this morning it felt like it was continuous."  Patient states she continues to have issues with "catching my breath." She states she started to have "pressure at the top of my stomach" prior to arrival.  Patient endorses fetal movement and denies vaginal concerns.  She reports that the SOB is increased with vomiting. She endorses a history of asthma and states "I only took my air pump as needed," going on to state that "I only have mild asthma so before I got pregnant I didn't really take my air pump at all."  Patient states that as of late, she does "get winded when walking from point a to point b."  However, she states it is not prolonged and usually just needs to take a minute.       OB History    Gravida  1   Para      Term      Preterm      AB      Living        SAB      TAB      Ectopic      Multiple      Live Births              Past Medical History:  Diagnosis Date  . Asthma     Past Surgical History:  Procedure Laterality Date  . NO PAST SURGERIES      Family History  Problem Relation Age of Onset  . Diabetes Maternal Grandfather     Social History   Tobacco Use  . Smoking status: Never Smoker  . Smokeless tobacco: Never Used  Substance Use Topics  . Alcohol use: Never    Alcohol/week: 0.0 standard drinks  . Drug use: No    Allergies:  Allergies  Allergen Reactions  . Mobic [Meloxicam] Shortness Of Breath    Medications Prior to  Admission  Medication Sig Dispense Refill Last Dose  . dicyclomine (BENTYL) 20 MG tablet Take 1 tablet (20 mg total) by mouth 2 (two) times daily. 20 tablet 0 07/11/2019 at Unknown time  . ondansetron (ZOFRAN ODT) 4 MG disintegrating tablet Take 1 tablet (4 mg total) by mouth every 8 (eight) hours as needed for nausea or vomiting. 10 tablet 0 07/11/2019 at Unknown time  . loratadine (CLARITIN) 10 MG tablet Take 10 mg by mouth daily as needed for allergies.   More than a month at Unknown time  . mometasone (ASMANEX) 220 MCG/INH inhaler Inhale 1 puff into the lungs at bedtime. 1 Inhaler 5 More than a month at Unknown time  . PROAIR HFA 108 (90 BASE) MCG/ACT inhaler As needed  0 More than a month at Unknown time  . promethazine (PHENERGAN) 25 MG tablet Take 1 tablet (25 mg total) by mouth every 6 (six) hours as needed for nausea or vomiting. 10 tablet 0 More than a month at Unknown  time    Review of Systems  Constitutional: Negative for chills and fever.  Respiratory: Positive for shortness of breath. Negative for cough and chest tightness.   Cardiovascular: Negative for chest pain.  Gastrointestinal: Positive for abdominal pain (Pressure), constipation (Yesterday that was hard to pass) and vomiting. Negative for diarrhea and nausea.  Genitourinary: Negative for difficulty urinating, dysuria, vaginal bleeding and vaginal discharge.  Musculoskeletal: Negative for back pain.  Neurological: Negative for dizziness, light-headedness and headaches.   Physical Exam   Blood pressure 127/73, pulse 84, temperature 98 F (36.7 C), resp. rate 16, height 5\' 4"  (1.626 m), weight 81.6 kg, last menstrual period 02/10/2019, SpO2 100 %.  Physical Exam  Constitutional: She is oriented to person, place, and time. She appears well-developed and well-nourished. No distress.  HENT:  Head: Normocephalic and atraumatic.  Eyes: Conjunctivae are normal.  Cardiovascular: Normal rate, regular rhythm and normal heart  sounds.  Respiratory: Breath sounds normal.  Increased effort, but no distress noted.  Sats 100%   GI: There is generalized abdominal tenderness.  Sharp shooting with palpation particularly in RUQ.  Musculoskeletal:        General: Normal range of motion.     Cervical back: Normal range of motion.  Neurological: She is alert and oriented to person, place, and time.  Skin: Skin is warm and dry.  Psychiatric: She has a normal mood and affect. Her behavior is normal.   Doppler: 156 bpm MAU Course  Procedures Results for orders placed or performed during the hospital encounter of 07/12/19 (from the past 24 hour(s))  Urinalysis, Routine w reflex microscopic     Status: Abnormal   Collection Time: 07/12/19  9:37 AM  Result Value Ref Range   Color, Urine YELLOW YELLOW   APPearance CLOUDY (A) CLEAR   Specific Gravity, Urine 1.013 1.005 - 1.030   pH 8.0 5.0 - 8.0   Glucose, UA NEGATIVE NEGATIVE mg/dL   Hgb urine dipstick NEGATIVE NEGATIVE   Bilirubin Urine NEGATIVE NEGATIVE   Ketones, ur NEGATIVE NEGATIVE mg/dL   Protein, ur NEGATIVE NEGATIVE mg/dL   Nitrite NEGATIVE NEGATIVE   Leukocytes,Ua MODERATE (A) NEGATIVE   RBC / HPF 0-5 0 - 5 RBC/hpf   WBC, UA 0-5 0 - 5 WBC/hpf   Bacteria, UA MANY (A) NONE SEEN   Squamous Epithelial / LPF 11-20 0 - 5   Mucus PRESENT    Amorphous Crystal PRESENT   CBC     Status: Abnormal   Collection Time: 07/12/19 10:37 AM  Result Value Ref Range   WBC 14.0 (H) 4.0 - 10.5 K/uL   RBC 4.39 3.87 - 5.11 MIL/uL   Hemoglobin 11.1 (L) 12.0 - 15.0 g/dL   HCT 07/14/19 (L) 73.4 - 19.3 %   MCV 76.1 (L) 80.0 - 100.0 fL   MCH 25.3 (L) 26.0 - 34.0 pg   MCHC 33.2 30.0 - 36.0 g/dL   RDW 79.0 24.0 - 97.3 %   Platelets 250 150 - 400 K/uL   nRBC 0.0 0.0 - 0.2 %  Comprehensive metabolic panel     Status: Abnormal   Collection Time: 07/12/19 10:37 AM  Result Value Ref Range   Sodium 135 135 - 145 mmol/L   Potassium 3.6 3.5 - 5.1 mmol/L   Chloride 107 98 - 111 mmol/L    CO2 17 (L) 22 - 32 mmol/L   Glucose, Bld 73 70 - 99 mg/dL   BUN 7 6 - 20 mg/dL   Creatinine, Ser 07/14/19  0.44 - 1.00 mg/dL   Calcium 8.7 (L) 8.9 - 10.3 mg/dL   Total Protein 6.2 (L) 6.5 - 8.1 g/dL   Albumin 2.9 (L) 3.5 - 5.0 g/dL   AST 15 15 - 41 U/L   ALT 12 0 - 44 U/L   Alkaline Phosphatase 62 38 - 126 U/L   Total Bilirubin 0.4 0.3 - 1.2 mg/dL   GFR calc non Af Amer >60 >60 mL/min   GFR calc Af Amer >60 >60 mL/min   Anion gap 11 5 - 15  Lipase, blood     Status: None   Collection Time: 07/12/19 10:37 AM  Result Value Ref Range   Lipase 23 11 - 51 U/L   US Abdomen Limited  Result Date: 07/12/2019 CLINICAL DATA:  Upper abdominal pain EXAM: ULTRASOUND ABDOMEN LIMITED RIGHT UPPER QUADRANT COMPARISON:  None. FINDINGS: Gallbladder: No gallstones or wall thickening visualized. There is no pericholecystic fluid. No sonographic Murphy sign noted by sonographer. Common bile duct: Diameter: 2 mm. No intrahepatic or extrahepatic biliary duct dilatation. Liver: No focal lesion identified. Within normal limits in parenchymal echogenicity. Portal vein is patent on color Doppler imaging with normal direction of blood flow towards the liver. Other: None. IMPRESSION: Study within normal limits. Electronically Signed   By: Bretta Bang III M.D.   On: 07/12/2019 11:25    MDM Physical Exam Labs:CBC, CMP, Lipase ABdominal US PPI AntiEmetic  Assessment and Plan  19 year old, G1P0 SIUP at 21.5 weeks Abdominal Pain SOB  -Consulted with Dr. Alysia Penna who advises: *CBC, CMP, Lipase *Abdominal US for RUQ Pain *Pepcid -Will give Zofran ODT and then pepcid -Patient without questions or concerns. -Will await results.   Cherre Robins 07/12/2019, 10:05 AM   Reassessment (11:56 AM)   -Nurse reports patient with improvement in symptoms. -Review of labs show no significant abnormal results.  -In room to discuss findings with patient who endorses improvement of symptoms. -Offered and declines  prescription for pepcid and states she will get tums instead.  -Encouraged to carry albuterol inhaler for treatment of SOB and to report worsening of symptoms.  -Instructed to keep regularly scheduled appt. -Encouraged to call or return to MAU if symptoms worsen or with the onset of new symptoms. -Discharged to home in stable condition.  Cherre Robins MSN, CNM Advanced Practice Provider, [facility]

## 2019-07-12 NOTE — MAU Note (Signed)
.   Shari Flores is a 19 y.o. at [redacted]w[redacted]d here in MAU reporting: pain in her upper abdomen when she woke up this morning. Denies any VB. Complaints of SOB when she woke up also LMP:  Onset of complaint: this morning Pain score: 3 Vitals:   07/12/19 0911  BP: 127/73  Pulse: 84  Resp: 16  Temp: 98 F (36.7 C)  SpO2: 100%     FHT:156 Lab orders placed from triage: UA

## 2019-07-12 NOTE — Discharge Instructions (Signed)
Asthma Attack Prevention, Adult Although you may not be able to control the fact that you have asthma, you can take actions to prevent episodes of asthma (asthma attacks). These actions include:  Creating a written plan for managing and treating your asthma attacks (asthma action plan).  Monitoring your asthma.  Avoiding things that can irritate your airways or make your asthma symptoms worse (asthma triggers).  Taking your medicines as directed.  Acting quickly if you have signs or symptoms of an asthma attack. What are some ways to prevent an asthma attack? Create a plan Work with your health care provider to create an asthma action plan. This plan should include:  A list of your asthma triggers and how to avoid them.  A list of symptoms that you experience during an asthma attack.  Information about when to take medicine and how much medicine to take.  Information to help you understand your peak flow measurements.  Contact information for your health care providers.  Daily actions that you can take to control asthma. Monitor your asthma To monitor your asthma:  Use your peak flow meter every morning and every evening for 2-3 weeks. Record the results in a journal. A drop in your peak flow numbers on one or more days may mean that you are starting to have an asthma attack, even if you are not having symptoms.  When you have asthma symptoms, write them down in a journal.  Avoid asthma triggers Work with your health care provider to find out what your asthma triggers are. This can be done by:  Being tested for allergies.  Keeping a journal that notes when asthma attacks occur and what may have contributed to them.  Asking your health care provider whether other medical conditions make your asthma worse. Common asthma triggers include:  Dust.  Smoke. This includes campfire smoke and secondhand smoke from tobacco products.  Pet dander.  Trees, grasses or  pollens.  Very cold, dry, or humid air.  Mold.  Foods that contain high amounts of sulfites.  Strong smells.  Engine exhaust and air pollution.  Aerosol sprays and fumes from household cleaners.  Household pests and their droppings, including dust mites and cockroaches.  Certain medicines, including NSAIDs. Once you have determined your asthma triggers, take steps to avoid them. Depending on your triggers, you may be able to reduce the chance of an asthma attack by:  Keeping your home clean. Have someone dust and vacuum your home for you 1 or 2 times a week. If possible, have them use a high-efficiency particulate arrestance (HEPA) vacuum.  Washing your sheets weekly in hot water.  Using allergy-proof mattress covers and casings on your bed.  Keeping pets out of your home.  Taking care of mold and water problems in your home.  Avoiding areas where people smoke.  Avoiding using strong perfumes or odor sprays.  Avoid spending a lot of time outdoors when pollen counts are high and on very windy days.  Talking with your health care provider before stopping or starting any new medicines. Medicines Take over-the-counter and prescription medicines only as told by your health care provider. Many asthma attacks can be prevented by carefully following your medicine schedule. Taking your medicines correctly is especially important when you cannot avoid certain asthma triggers. Even if you are doing well, do not stop taking your medicine and do not take less medicine. Act quickly If an asthma attack happens, acting quickly can decrease how severe it is and   how long it lasts. Take these actions:  Pay attention to your symptoms. If you are coughing, wheezing, or having difficulty breathing, do not wait to see if your symptoms go away on their own. Follow your asthma action plan.  If you have followed your asthma action plan and your symptoms are not improving, call your health care  provider or seek immediate medical care at the nearest hospital. It is important to write down how often you need to use your fast-acting rescue inhaler. You can track how often you use an inhaler in your journal. If you are using your rescue inhaler more often, it may mean that your asthma is not under control. Adjusting your asthma treatment plan may help you to prevent future asthma attacks and help you to gain better control of your condition. How can I prevent an asthma attack when I exercise? Exercise is a common asthma trigger. To prevent asthma attacks during exercise:  Follow advice from your health care provider about whether you should use your fast-acting inhaler before exercising. Many people with asthma experience exercise-induced bronchoconstriction (EIB). This condition often worsens during vigorous exercise in cold, humid, or dry environments. Usually, people with EIB can stay very active by using a fast-acting inhaler before exercising.  Avoid exercising outdoors in very cold or humid weather.  Avoid exercising outdoors when pollen counts are high.  Warm up and cool down when exercising.  Stop exercising right away if asthma symptoms start. Consider taking part in exercises that are less likely to cause asthma symptoms such as:  Indoor swimming.  Biking.  Walking.  Hiking.  Playing football. This information is not intended to replace advice given to you by your health care provider. Make sure you discuss any questions you have with your health care provider. Document Revised: 04/15/2017 Document Reviewed: 10/18/2015 Elsevier Patient Education  2020 [employer]  

## 2019-07-13 LAB — CULTURE, OB URINE: Culture: NO GROWTH

## 2019-08-15 ENCOUNTER — Other Ambulatory Visit: Payer: Self-pay

## 2019-08-15 ENCOUNTER — Inpatient Hospital Stay (HOSPITAL_COMMUNITY)
Admission: AD | Admit: 2019-08-15 | Discharge: 2019-08-15 | Disposition: A | Discharge status: Home / Self Care | Payer: Medicaid Other | Attending: Obstetrics and Gynecology | Admitting: Obstetrics and Gynecology

## 2019-08-15 DIAGNOSIS — R05 Cough: Secondary | ICD-10-CM | POA: Diagnosis present

## 2019-08-15 DIAGNOSIS — B348 Other viral infections of unspecified site: Secondary | ICD-10-CM | POA: Insufficient documentation

## 2019-08-15 DIAGNOSIS — Z3A26 26 weeks gestation of pregnancy: Secondary | ICD-10-CM | POA: Diagnosis not present

## 2019-08-15 DIAGNOSIS — O98512 Other viral diseases complicating pregnancy, second trimester: Secondary | ICD-10-CM | POA: Diagnosis not present

## 2019-08-15 DIAGNOSIS — Z20822 Contact with and (suspected) exposure to covid-19: Secondary | ICD-10-CM | POA: Insufficient documentation

## 2019-08-15 LAB — RESPIRATORY PANEL BY RT PCR (FLU A&B, COVID)
Influenza A by PCR: NEGATIVE
Influenza B by PCR: NEGATIVE
SARS Coronavirus 2 by RT PCR: NEGATIVE

## 2019-08-15 MED ORDER — PROMETHAZINE HCL 25 MG RE SUPP: 25.0000 mg | Freq: Four times a day (QID) | RECTAL | 0 refills | Status: DC | PRN

## 2019-08-15 NOTE — MAU Note (Addendum)
Sore throat started yesterday. Today sore throat continues along with nasal congestion, SOB, headache, vomiting, no diarrhea. Some lower back pain today. Denies VB or LOF. Took one regular strength Tylenol this am which did not help symptoms. Covid swab obtained in Triage without difficulty and pt tol well.

## 2019-08-15 NOTE — MAU Note (Signed)
Jessica Emly CNM in Triage to see pt.  

## 2019-08-15 NOTE — MAU Provider Note (Signed)
History     CSN: [id number]  Arrival date and time: 08/15/19 2005   None     No chief complaint on file.  Shari Flores is a 19 y.o. G1P0 at [redacted]w[redacted]d who receives care at [facility].  She presents today for cough, runny, nose, vomiting, headache, and SOB.  Patient states she had a sore throat yesterday and the other symptoms presented today.  Patient states she has been unable to keep anything down today and took Zofran this morning around "9 or 10."  Patient states she has tried to eat soup around 11 or 12, but was unable to keep it down.  She states she has not been able to keep down sips of fluid.  Patient reports she had strep throat last year with the same symptoms and the addition of body aches.  Patient denies body aches, but does report some back soreness. Patient further reports that last year a culture was required to diagnosis her strep throat as initial collections returned negative. Patient states she has not been feeling nauseous until she tries to drink or eat.  Patient denies being around sick individuals and states that no one in her home is sick.  However, patient works at [employer].  Patient endorses fetal movement and denies vaginal discharge, leaking, or bleeding.  Patient denies abdominal cramping or contractions.  Patient states she has taken mucinex, drank lemon tea, soup, tylenol, and vitamin c for her symptoms.       OB History    Gravida  1   Para      Term      Preterm      AB      Living        SAB      TAB      Ectopic      Multiple      Live Births              Past Medical History:  Diagnosis Date  . Asthma     Past Surgical History:  Procedure Laterality Date  . NO PAST SURGERIES      Family History  Problem Relation Age of Onset  . Diabetes Maternal Grandfather     Social History   Tobacco Use  . Smoking status: Never Smoker  . Smokeless tobacco: Never Used  Substance Use Topics  . Alcohol use: Never   Alcohol/week: 0.0 standard drinks  . Drug use: No    Allergies:  Allergies  Allergen Reactions  . Mobic [Meloxicam] Shortness Of Breath    Medications Prior to Admission  Medication Sig Dispense Refill Last Dose  . dicyclomine (BENTYL) 20 MG tablet Take 1 tablet (20 mg total) by mouth 2 (two) times daily. 20 tablet 0   . loratadine (CLARITIN) 10 MG tablet Take 10 mg by mouth daily as needed for allergies.     . mometasone (ASMANEX) 220 MCG/INH inhaler Inhale 1 puff into the lungs at bedtime. 1 Inhaler 5   . ondansetron (ZOFRAN ODT) 4 MG disintegrating tablet Take 1 tablet (4 mg total) by mouth every 8 (eight) hours as needed for nausea or vomiting. 10 tablet 0   . PROAIR HFA 108 (90 BASE) MCG/ACT inhaler As needed  0   . promethazine (PHENERGAN) 25 MG tablet Take 1 tablet (25 mg total) by mouth every 6 (six) hours as needed for nausea or vomiting. 10 tablet 0     Review of Systems  Constitutional: Positive for chills. Negative  for fever.  HENT: Positive for rhinorrhea, sinus pressure and sore throat. Negative for sinus pain and trouble swallowing.   Respiratory: Negative for cough and shortness of breath.   Gastrointestinal: Positive for nausea and vomiting. Negative for abdominal pain, constipation and diarrhea.  Genitourinary: Negative for vaginal bleeding and vaginal discharge.  Musculoskeletal: Negative for back pain.  Neurological: Positive for headaches. Negative for dizziness and light-headedness.   Physical Exam   Blood pressure 132/74, pulse 86, temperature 98.2 F (36.8 C), resp. rate 18, height 5\' 4"  (1.626 m), weight 88.5 kg, last menstrual period 02/10/2019, SpO2 99 %.  Physical Exam  Constitutional: She is oriented to person, place, and time. She appears well-developed. She appears distressed.  Patient appears fatigued.  Dry-heaving x 2 while HPI is obtained.   HENT:  Head: Normocephalic and atraumatic.  Right Ear: Ear canal normal. No drainage. Tympanic membrane  is not erythematous. No middle ear effusion.  Left Ear: Ear canal normal. No drainage. Tympanic membrane is not erythematous.  No middle ear effusion.  Mouth/Throat: Mucous membranes are not pale and not dry. Posterior oropharyngeal erythema present. No oropharyngeal exudate, posterior oropharyngeal edema or tonsillar abscesses.  Eyes: Conjunctivae are normal.  Cardiovascular: Normal rate, regular rhythm and normal heart sounds.  Respiratory: Effort normal and breath sounds normal. No respiratory distress. She has no wheezes.  Musculoskeletal:        General: Normal range of motion.     Cervical back: Normal range of motion.  Neurological: She is alert and oriented to person, place, and time.  Skin: Skin is warm.  Psychiatric: She has a normal mood and affect. Her behavior is normal.   FHR 154 by doppler MAU Course  Procedures Results for orders placed or performed during the hospital encounter of 08/15/19 (from the past 24 hour(s))  Respiratory Panel by RT PCR (Flu A&B, Covid) - Nasopharyngeal Swab     Status: None   Collection Time: 08/15/19  8:45 PM   Specimen: Nasopharyngeal Swab  Result Value Ref Range   SARS Coronavirus 2 by RT PCR NEGATIVE NEGATIVE   Influenza A by PCR NEGATIVE NEGATIVE   Influenza B by PCR NEGATIVE NEGATIVE    MDM Physical Exam Cultures: Covid, Strep, and Influenza  Assessment and Plan  19 year old SIUP at 26.4 weeks Viral Infection  -Cultures collected as above. -Patient questions if she would receive IV fluids and informed that since her symptoms are not pregnancy related transfer to [facility] would be necessary. -Patient declines transfer, but is accepting of phenergan suppository. -Rx for Phenergan 25 mg suppository sent to 24 hour pharmacy.  -Informed that throat does have some redness, but findings not definitive for strep throat. -Will await results and encouraged management and treatment based on symptoms.  -Informed that provider will call if  Covid returns positive, but other results will be released to mychart.  -Patient without questions or concerns. -Encouraged to call or return to MAU if symptoms worsen or with the onset of new symptoms. -Discharged to home in stable condition.   Maryann Conners 08/15/2019, 8:57 PM

## 2019-08-15 NOTE — Progress Notes (Signed)
Gerrit Heck CNM on unit and aware of pt's symptoms. Will see pt in Triage

## 2019-08-15 NOTE — Discharge Instructions (Signed)
Pregnancy and COVID-19 Coronavirus disease, also called COVID-19, is an infection of the lungs and airways (respiratory tract). It is unclear at this time if pregnancy makes it more likely for you to get COVID-19, or what effects the infection may have on your unborn baby. However, pregnancy causes changes to your heart, lungs, and your body's disease-fighting system (immune system). Some of these changes make it more likely for you to get sick and have more serious illness. Therefore, it is important for you to take precautions in order to protect yourself and your unborn baby. There have been studies showing that obesity and diabetes may put you at higher risk for serious illness. If you are pregnant and are obese or have diabetes, you should take extra precautions to protect yourself from the virus. Work with your health care team to develop a plan to protect yourself from all infections, including COVID-19. This is one way for you to stay healthy during your pregnancy and to keep your baby healthy as well. How does this affect me? If you get COVID-19, there is a risk that you may:  Get a respiratory illness that can lead to pneumonia.  Give birth to your baby before 37 weeks of pregnancy (premature birth). If you have or may have COVID-19, your health care provider may recommend special precautions around your pregnancy. This may affect how you:  Receive care before delivery (prenatal care). How you visit your health care provider may change. Tests and scans may need to be performed differently.  Receive care during labor and delivery. This may affect your birth plan, including who may be with you during labor and delivery.  Receive care after you deliver your baby (postpartum care). You may stay longer in the hospital and in a special room.  Feed your baby after he or she is born. Pregnancy can be an especially stressful time because of the changes in your body and the preparation involved in  becoming a parent. In addition, you may be feeling especially fearful, anxious, or stressed because of COVID-19 and how it is affecting you. How does this affect my baby? It is not known whether a mother will transmit the virus to her unborn baby. There is a risk that if you get COVID-19:  The virus that causes COVID-19 can pass to your baby.  You may have premature birth. Your baby may require more medical care if this happens. What can I do to lower my risk?  There is no vaccine to help prevent COVID-19. However, there are actions that you can take to protect yourself and others from this virus. Cleaning and personal hygiene  Wash your hands often with soap and water for at least 20 seconds. If soap and water are not available, use alcohol-based hand sanitizer.  Avoid touching your mouth, face, eyes, or nose.  Clean and disinfect objects and surfaces that are frequently touched every day. These may include: ? Counters and tables. ? Doorknobs and light switches. ? Sinks and faucets. ? Electronics such as phones, remote controls, keyboards, computers, and tablets. Stay away from others  Stay away from people who are sick, if possible.  Avoid social gatherings and travel.  Stay home as much as possible. Follow these instructions: Breastfeeding It is not known if the virus that causes COVID-19 can pass through breast milk to your baby. You should make a plan for feeding your infant with your family and your health care team. If you have or may have COVID-19,  your health care provider may recommend that you take precautions while breastfeeding, such as:  Washing your hands before feeding your baby.  Wearing a mask while feeding your baby.  Pumping or expressing breast milk to feed to your baby. If possible, ask someone in your household who is not sick to feed your baby the expressed breast milk. ? Wash your hands before touching pump parts. ? Wash and disinfect all pump parts  after expressing milk. Follow the manufacturer's instructions to clean and disinfect all pump parts. General instructions  If you think you have a COVID-19 infection, contact your health care provider right away. Tell your health care provider that you think you may have a COVID-19 infection.  Follow your health care provider's instructions on taking medicines. Some medicines may be unsafe to take during pregnancy.  Cover your mouth and nose by wearing a mask or other cloth covering over your face when you go out in public.  Find ways to manage stress. These may include: ? Using relaxation techniques like meditation and deep breathing. ? Getting regular exercise. Most women can continue their usual exercise routine during pregnancy. Ask your health care provider what activities are safe for you. ? Seeking support from family, friends, or spiritual resources. If you cannot be together in person, you can still connect by phone calls, texts, video calls, or online messaging. ? Spending time doing relaxing activities that you enjoy, like listening to music or reading a good book.  Ask for help if you have counseling or nutritional needs during pregnancy. Your health care provider can offer advice or refer you to resources or specialists who can help you with various needs.  Keep all follow-up visits as told by your health care provider. This is important. Where to find more information Centers for Disease Control and Prevention (CDC): http://gutierrez-robinson.com/ World Health Organization [facility]): https://www.morales.com/ [employer] (ACOG): StickerEmporium.tn Questions to ask your health care team  What should I do if I have COVID-19 symptoms?  How will COVID-19 affect my prenatal care visits, tests and scans, labor and  delivery, and postpartum care?  Should I plan to breastfeed my baby?  Where can I find mental health resources?  Where can I find support if I have financial concerns? Contact a health care provider if:  You have signs and symptoms of infection, including a fever or cough. Tell your health care team that you think you may have a COVID-19 infection.  You have strong emotions, such as sadness or anxiety.  You feel unsafe in your home and need help finding a safe place to live.  You have bloody or watery vaginal discharge or vaginal bleeding. Get help right away if:  You have signs or symptoms of labor before 37 weeks of pregnancy. These include: ? Contractions that are 5 minutes or less apart, or that increase in frequency, intensity, or length. ? Sudden, sharp pain in the abdomen or in the lower back. ? A gush or trickle of fluid from your vagina.  You have signs of more serious illness such as: ? You have difficulty breathing. ? You have chest pain. ? You have a fever greater than 102F (39C) or higher that does not go away. ? You cannot drink fluids without vomiting. ? You feel extremely weak or you faint. These symptoms may represent a serious problem that is an emergency. Do not wait to see if the symptoms will go away. Get medical help right away. Call  your local emergency services (911 in the [place] Do not drive yourself to the hospital. Summary  Coronavirus disease, also called COVID-19, is an infection of the lungs and airways (respiratory tract). It is unclear at this time if pregnancy makes you more susceptible to COVID-19 and what effects it may have on unborn babies.  It is important to take precautions to protect yourself and your developing baby. This includes washing your hands often, avoiding touching your mouth, face, eyes, or nose, avoiding social gatherings and travel, and staying away from people who are sick.  If you think you have a COVID-19 infection,  contact your health care provider right away. Tell your health care provider that you think you may have a COVID-19 infection.  If you have or may have COVID-19, your health care provider may recommend special precautions during your pregnancy, labor and delivery, and after your baby is born. This information is not intended to replace advice given to you by your health care provider. Make sure you discuss any questions you have with your health care provider. Document Revised: 02/23/2019 Document Reviewed: 08/29/2018 Elsevier Patient Education  2020 [employer] Inc. Pregnancy and Influenza  Influenza, also called the flu, is an infection of the lungs and airways (respiratory tract). If you are pregnant, you are more likely to catch the flu. You are also more likely to have a more serious case of the flu. This is because pregnancy causes changes to your body's disease-fighting system (immune system), heart, and lungs. If you develop a bad case of the flu, especially with a high fever, this can cause problems for you and your developing baby. How do people get the flu? The flu is caused by a type of germ called a virus. It spreads when virus particles get passed from person to person by:  Being near a sick person who is coughing or sneezing.  Touching something that has the virus on it and then touching your mouth, nose, or face. The influenza virus is most common during the fall and winter. How can I protect myself against the flu?  Get a flu shot. The best way to prevent the flu is to get a flu shot before flu season starts. The flu shot is not dangerous for your developing baby. It may even help protect your baby from the flu for up to 6 months after birth.  Wash your hands often with soap and warm water. If soap and water are not available, use hand sanitizer.  Do not come in close contact with sick people.  Do not share food, drinks, or utensils with other people.  Avoid touching your  eyes, nose, and mouth.  Clean frequently used surfaces at home, school, or work.  Practice healthy lifestyle habits, such as: ? Eating a healthy, balanced diet. ? Drinking plenty of fluids. ? Exercising regularly or as told by your health care provider. ? Sleeping 7-9 hours each night. ? Finding ways to manage stress. What should I do if I have flu symptoms?  If you have any symptoms of the flu, even after getting a flu shot, contact your health care provider right away.  To reduce fever, take over-the-counter acetaminophen as told by your health care provider.  If you have the flu, you may get antiviral medicine to keep the flu from becoming severe and to shorten how long it lasts.  Avoid spreading the flu to others: ? Stay home until you are well. ? Cover your nose and mouth when  you cough or sneeze. ? Wash your hands often. Follow these instructions at home:  Take over-the-counter and prescription medicines only as told by your health care provider. Do not take any medicine, including cold or flu medicine, unless your health care provider tells you to do so.  If you were prescribed antiviral medicine, take it as told by your health care provider. Do not stop taking the antiviral medicine even if you start to feel better.  Eat a nutrient-rich diet that includes fresh fruits and vegetables, whole grains, lean protein, and low-fat dairy.  Drink enough fluid to keep your urine clear or pale yellow.  Get plenty of rest. Contact a health care provider if:  You have fever or chills.  You have a cough, sore throat, or stuffy nose.  You have worsening or unusual: ? Muscle aches. ? Headache. ? Tiredness. ? Loss of appetite.  You have vomiting or diarrhea. Get help right away if:  You have trouble breathing.  You have chest pain.  You have abdominal pain.  You begin to have labor pains.  You have a fever that does not go down 24 hours after you take medicine.  You do  not feel your baby move.  You have diarrhea or vomiting that will not go away.  You have dizziness or confusion.  Your symptoms do not improve, even with treatment. Summary  If you are pregnant, you are more likely to catch the flu. You are also more likely to have a more serious case of the flu.  If you have flu-like symptoms, call your health care provider right away. If you develop a bad case of the flu, especially with a high fever, this can be dangerous for your developing baby.  The best way to prevent the flu is to get a flu shot before flu season starts. The flu shot is not dangerous for your developing baby.  If you have the flu and were prescribed antiviral medicine, take it as told by your health care provider. This information is not intended to replace advice given to you by your health care provider. Make sure you discuss any questions you have with your health care provider. Document Revised: 08/25/2018 Document Reviewed: 06/29/2016 Elsevier Patient Education  [place].

## 2019-08-15 NOTE — Progress Notes (Signed)
Gerrit Heck CNM gave pt d/c instructions in Triage. Written and verbal d/c instructions reviewed by RN and understanding voiced. Throat swab obtained for Strep A test before pt d/c home.

## 2019-08-19 LAB — CULTURE, GROUP A STREP (THRC)

## 2019-08-20 ENCOUNTER — Other Ambulatory Visit: Payer: Self-pay

## 2019-08-20 DIAGNOSIS — J020 Streptococcal pharyngitis: Secondary | ICD-10-CM

## 2019-08-20 MED ORDER — PENICILLIN V POTASSIUM 500 MG PO TABS: 500.0000 mg | ORAL_TABLET | Freq: Two times a day (BID) | ORAL | 0 refills | Status: DC

## 2019-09-24 ENCOUNTER — Encounter (HOSPITAL_COMMUNITY): Payer: Self-pay | Admitting: Obstetrics and Gynecology

## 2019-09-24 ENCOUNTER — Other Ambulatory Visit: Payer: Self-pay

## 2019-09-24 ENCOUNTER — Inpatient Hospital Stay (HOSPITAL_COMMUNITY)
Admission: AD | Admit: 2019-09-24 | Discharge: 2019-09-24 | Disposition: A | Discharge status: Home / Self Care | Payer: Medicaid Other | Attending: Obstetrics and Gynecology | Admitting: Obstetrics and Gynecology

## 2019-09-24 DIAGNOSIS — Z3A32 32 weeks gestation of pregnancy: Secondary | ICD-10-CM | POA: Diagnosis not present

## 2019-09-24 DIAGNOSIS — Z0371 Encounter for suspected problem with amniotic cavity and membrane ruled out: Secondary | ICD-10-CM | POA: Diagnosis not present

## 2019-09-24 DIAGNOSIS — O26893 Other specified pregnancy related conditions, third trimester: Secondary | ICD-10-CM | POA: Diagnosis present

## 2019-09-24 LAB — POCT FERN TEST: POCT Fern Test: NEGATIVE

## 2019-09-24 NOTE — [facility] Provider Note (Signed)
First Provider Initiated Contact with Patient 09/24/19 1830       S: Ms. Shari Flores is a 19 y.o. G1P0 at [redacted]w[redacted]d  who presents to [facility] today complaining of leaking of fluid x 1 episode earlier today while at work. She denies vaginal bleeding. She denies contractions. She reports normal fetal movement.   Had intercourse within the last 24 hours. Leaking has not continued.  Goes to Dr. Charlotta Newton for prenatal care. Next appointment is 5/18.  O: BP 131/72 (BP Location: Left Arm)   Pulse 89   Temp 97.7 F (36.5 C) (Oral)   Resp 17   LMP 02/10/2019   SpO2 100% Comment: room air GENERAL: Well-developed, well-nourished female in no acute distress.  HEAD: Normocephalic, atraumatic.  CHEST: Normal effort of breathing, regular heart rate ABDOMEN: Soft, nontender, gravid PELVIC: Normal external female genitalia. Vagina is pink and rugated. Cervix with normal contour, no lesions. Normal discharge.  No pooling.   Cervical exam: deferred     Fetal Monitoring: Baseline: 150 Variability: moderate Accelerations: 15x15 Decelerations: none Contractions: UI  No results found for this or any previous visit (from the past 24 hour(s)).   A: [facility] at [redacted]w[redacted]d  Membranes intact  P: Discharge home  Discussed reasons to return to [facility] Keep f/u with OB  Judeth Horn, NP 09/24/2019 6:31 PM

## 2019-09-24 NOTE — [facility] Note (Signed)
PT was at work and felt a small gush of fluid about an hour ago. Unsure of color, no odor of urine.  Denies cntx or VB, +FM.

## 2019-09-24 NOTE — Discharge Instructions (Signed)
Fetal Movement Counts Patient Name: ________________________________________________ Patient Due Date: ____________________ What is a fetal movement count?  A fetal movement count is the number of times that you feel your baby move during a certain amount of time. This may also be called a fetal kick count. A fetal movement count is recommended for every pregnant woman. You may be asked to start counting fetal movements as early as week 28 of your pregnancy. Pay attention to when your baby is most active. You may notice your baby's sleep and wake cycles. You may also notice things that make your baby move more. You should do a fetal movement count:  When your baby is normally most active.  At the same time each day. A good time to count movements is while you are resting, after having something to eat and drink. How do I count fetal movements? 1. Find a quiet, comfortable area. Sit, or lie down on your side. 2. Write down the date, the start time and stop time, and the number of movements that you felt between those two times. Take this information with you to your health care visits. 3. Write down your start time when you feel the first movement. 4. Count kicks, flutters, swishes, rolls, and jabs. You should feel at least 10 movements. 5. You may stop counting after you have felt 10 movements, or if you have been counting for 2 hours. Write down the stop time. 6. If you do not feel 10 movements in 2 hours, contact your health care provider for further instructions. Your health care provider may want to do additional tests to assess your baby's well-being. Contact a health care provider if:  You feel fewer than 10 movements in 2 hours.  Your baby is not moving like he or she usually does. Date: ____________ Start time: ____________ Stop time: ____________ Movements: ____________ Date: ____________ Start time: ____________ Stop time: ____________ Movements: ____________ Date: ____________  Start time: ____________ Stop time: ____________ Movements: ____________ Date: ____________ Start time: ____________ Stop time: ____________ Movements: ____________ Date: ____________ Start time: ____________ Stop time: ____________ Movements: ____________ Date: ____________ Start time: ____________ Stop time: ____________ Movements: ____________ Date: ____________ Start time: ____________ Stop time: ____________ Movements: ____________ Date: ____________ Start time: ____________ Stop time: ____________ Movements: ____________ Date: ____________ Start time: ____________ Stop time: ____________ Movements: ____________ This information is not intended to replace advice given to you by your health care provider. Make sure you discuss any questions you have with your health care provider. Document Revised: 12/21/2018 Document Reviewed: 12/21/2018 Elsevier Patient Education  2020 Elsevier Inc.  

## 2019-11-06 ENCOUNTER — Telehealth (HOSPITAL_COMMUNITY): Payer: Self-pay | Admitting: *Deleted

## 2019-11-06 ENCOUNTER — Encounter (HOSPITAL_COMMUNITY): Payer: Self-pay | Admitting: *Deleted

## 2019-11-06 LAB — OB RESULTS CONSOLE GBS: GBS: POSITIVE

## 2019-11-06 NOTE — Telephone Encounter (Signed)
Preadmission screen  

## 2019-11-12 ENCOUNTER — Other Ambulatory Visit (HOSPITAL_COMMUNITY): Payer: Medicaid Other

## 2019-11-13 NOTE — H&P (Signed)
HPI: 19 y/o G1P0 @ [redacted]w[redacted]d estimated gestational age (as dated by LMP c/w 20 week ultrasound) presents for elective IOL.   no Leaking of Fluid,   no Vaginal Bleeding,   no Uterine Contractions,  + Fetal Movement.  Prenatal care has been provided by Dr. Charlotta Newton  ROS: no HA, no epigastric pain, no visual changes.    Pregnancy complicated by: 1) GBS positive- PCN in labor 2) Rh neg, s/p RhoGAM on 08/28/19 3) h/o asthma- using proair as needed   Prenatal Transfer Tool  Maternal Diabetes: No Genetic Screening: Normal Maternal Ultrasounds/Referrals: Normal Fetal Ultrasounds or other Referrals:  None Maternal Substance Abuse:  No Significant Maternal Medications:  None Significant Maternal Lab Results: Group B Strep positive   PNL:  GBS positive, Rub Immune, Hep B neg, RPR NR, HIV neg, GC/C neg, glucola:77 Hgb: 10.9 Blood type: A negative  Immunizations: Tdap: 5/11  OBHx: primip PMHx:  none Meds:  PNV Allergy:   Allergies  Allergen Reactions  . Mobic [Meloxicam] Shortness Of Breath   SurgHx: none SocHx:   no Tobacco, no  EtOH, no Illicit Drugs  O: LMP 02/10/2019   To be obtained Gen. AAOx3, NAD CV.  RRR  No murmur.  Resp. CTAB, no wheeze or crackles. Abd. Gravid,  no tenderness,  no rigidity,  no guarding Extr.  no edema B/L , no calf tenderness, neg Homan's B/L FHT: 145 by doppler in office SVE: closed/soft/-3   Labs: see orders  A/P:  19 y.o. G1P0 @ [redacted]w[redacted]d EGA who presents for IOL for term pregnancy/elective IOL -FWB:  Reassuring by doppler -Labor: plan for IOL with cytotec -GBS: positive- PCN per protocol -Pain management: IV or epidural upon request  Myna Hidalgo, DO [phone] (cell) [phone] (office)

## 2019-11-14 ENCOUNTER — Inpatient Hospital Stay (HOSPITAL_COMMUNITY): Payer: Medicaid Other | Admitting: Anesthesiology

## 2019-11-14 ENCOUNTER — Inpatient Hospital Stay (HOSPITAL_COMMUNITY): Payer: Medicaid Other

## 2019-11-14 ENCOUNTER — Inpatient Hospital Stay (HOSPITAL_COMMUNITY)
Admission: RE | Admit: 2019-11-14 | Discharge: 2019-11-17 | DRG: 806 | Disposition: A | Discharge status: Home / Self Care | Payer: Medicaid Other | Attending: Obstetrics & Gynecology | Admitting: Obstetrics & Gynecology

## 2019-11-14 ENCOUNTER — Encounter (HOSPITAL_COMMUNITY): Payer: Self-pay | Admitting: Obstetrics & Gynecology

## 2019-11-14 ENCOUNTER — Other Ambulatory Visit: Payer: Self-pay

## 2019-11-14 DIAGNOSIS — O99214 Obesity complicating childbirth: Secondary | ICD-10-CM | POA: Diagnosis present

## 2019-11-14 DIAGNOSIS — Z6791 Unspecified blood type, Rh negative: Secondary | ICD-10-CM

## 2019-11-14 DIAGNOSIS — O26893 Other specified pregnancy related conditions, third trimester: Secondary | ICD-10-CM | POA: Diagnosis present

## 2019-11-14 DIAGNOSIS — E669 Obesity, unspecified: Secondary | ICD-10-CM | POA: Diagnosis present

## 2019-11-14 DIAGNOSIS — Z3A39 39 weeks gestation of pregnancy: Secondary | ICD-10-CM

## 2019-11-14 DIAGNOSIS — O99824 Streptococcus B carrier state complicating childbirth: Secondary | ICD-10-CM | POA: Diagnosis present

## 2019-11-14 LAB — CBC
HCT: 33.5 % — ABNORMAL LOW (ref 36.0–46.0)
Hemoglobin: 10.6 g/dL — ABNORMAL LOW (ref 12.0–15.0)
MCH: 23.2 pg — ABNORMAL LOW (ref 26.0–34.0)
MCHC: 31.6 g/dL (ref 30.0–36.0)
MCV: 73.5 fL — ABNORMAL LOW (ref 80.0–100.0)
Platelets: 309 10*3/uL (ref 150–400)
RBC: 4.56 MIL/uL (ref 3.87–5.11)
RDW: 17.2 % — ABNORMAL HIGH (ref 11.5–15.5)
WBC: 16.1 10*3/uL — ABNORMAL HIGH (ref 4.0–10.5)
nRBC: 0 % (ref 0.0–0.2)

## 2019-11-14 LAB — TYPE AND SCREEN
ABO/RH(D): A NEG
Antibody Screen: NEGATIVE

## 2019-11-14 LAB — ABO/RH: ABO/RH(D): A NEG

## 2019-11-14 LAB — RPR: RPR Ser Ql: NONREACTIVE

## 2019-11-14 MED ORDER — OXYCODONE-ACETAMINOPHEN 5-325 MG PO TABS: 1.0000 | ORAL_TABLET | ORAL | Status: DC | PRN

## 2019-11-14 MED ORDER — PHENYLEPHRINE 40 MCG/ML (10ML) SYRINGE FOR IV PUSH (FOR BLOOD PRESSURE SUPPORT): 80.0000 ug | PREFILLED_SYRINGE | INTRAVENOUS | Status: DC | PRN

## 2019-11-14 MED ORDER — EPHEDRINE 5 MG/ML INJ: 10.0000 mg | INTRAVENOUS | Status: DC | PRN

## 2019-11-14 MED ORDER — SODIUM CHLORIDE 0.9 % IV SOLN
5.0000 10*6.[IU] | Freq: Once | INTRAVENOUS | Status: AC
  Administered 2019-11-14: 5 10*6.[IU] via INTRAVENOUS
  Filled 2019-11-14: qty 5

## 2019-11-14 MED ORDER — OXYCODONE-ACETAMINOPHEN 5-325 MG PO TABS: 2.0000 | ORAL_TABLET | ORAL | Status: DC | PRN

## 2019-11-14 MED ORDER — SODIUM CHLORIDE (PF) 0.9 % IJ SOLN
INTRAMUSCULAR | Status: DC | PRN
  Administered 2019-11-14: 12 mL/h via EPIDURAL

## 2019-11-14 MED ORDER — LIDOCAINE HCL (PF) 1 % IJ SOLN: 30.0000 mL | INTRAMUSCULAR | Status: DC | PRN

## 2019-11-14 MED ORDER — LACTATED RINGERS IV SOLN: INTRAVENOUS | Status: DC

## 2019-11-14 MED ORDER — TERBUTALINE SULFATE 1 MG/ML IJ SOLN: 0.2500 mg | Freq: Once | INTRAMUSCULAR | Status: DC | PRN

## 2019-11-14 MED ORDER — OXYTOCIN BOLUS FROM INFUSION
333.0000 mL | Freq: Once | INTRAVENOUS | Status: AC
  Administered 2019-11-15: 333 mL via INTRAVENOUS

## 2019-11-14 MED ORDER — ONDANSETRON HCL 4 MG/2ML IJ SOLN
4.0000 mg | Freq: Four times a day (QID) | INTRAMUSCULAR | Status: DC | PRN
  Administered 2019-11-15: 4 mg via INTRAVENOUS
  Filled 2019-11-14: qty 2

## 2019-11-14 MED ORDER — FENTANYL-BUPIVACAINE-NACL 0.5-0.125-0.9 MG/250ML-% EP SOLN: 12.0000 mL/h | EPIDURAL | Status: DC | PRN

## 2019-11-14 MED ORDER — OXYTOCIN-SODIUM CHLORIDE 30-0.9 UT/500ML-% IV SOLN
2.5000 [IU]/h | INTRAVENOUS | Status: DC
  Filled 2019-11-14: qty 500

## 2019-11-14 MED ORDER — FENTANYL-BUPIVACAINE-NACL 0.5-0.125-0.9 MG/250ML-% EP SOLN
EPIDURAL | Status: AC
  Filled 2019-11-14: qty 250

## 2019-11-14 MED ORDER — ACETAMINOPHEN 325 MG PO TABS
650.0000 mg | ORAL_TABLET | ORAL | Status: DC | PRN
  Administered 2019-11-14 (×2): 650 mg via ORAL
  Filled 2019-11-14 (×2): qty 2

## 2019-11-14 MED ORDER — PENICILLIN G POT IN DEXTROSE 60000 UNIT/ML IV SOLN
3.0000 10*6.[IU] | INTRAVENOUS | Status: DC
  Administered 2019-11-14 (×2): 3 10*6.[IU] via INTRAVENOUS
  Filled 2019-11-14 (×2): qty 50

## 2019-11-14 MED ORDER — SOD CITRATE-CITRIC ACID 500-334 MG/5ML PO SOLN: 30.0000 mL | ORAL | Status: DC | PRN

## 2019-11-14 MED ORDER — MISOPROSTOL 25 MCG QUARTER TABLET
25.0000 ug | ORAL_TABLET | ORAL | Status: DC | PRN
  Administered 2019-11-14 (×3): 25 ug via VAGINAL
  Filled 2019-11-14 (×3): qty 1

## 2019-11-14 MED ORDER — LACTATED RINGERS IV SOLN: 500.0000 mL | Freq: Once | INTRAVENOUS | Status: DC

## 2019-11-14 MED ORDER — LIDOCAINE HCL (PF) 1 % IJ SOLN
INTRAMUSCULAR | Status: DC | PRN
  Administered 2019-11-14: 3 mL via EPIDURAL
  Administered 2019-11-14: 2 mL via EPIDURAL
  Administered 2019-11-14: 5 mL via EPIDURAL

## 2019-11-14 MED ORDER — FENTANYL CITRATE (PF) 100 MCG/2ML IJ SOLN
INTRAMUSCULAR | Status: DC | PRN
  Administered 2019-11-14 – 2019-11-15 (×2): 100 ug via EPIDURAL

## 2019-11-14 MED ORDER — FENTANYL CITRATE (PF) 100 MCG/2ML IJ SOLN
50.0000 ug | INTRAMUSCULAR | Status: DC | PRN
  Administered 2019-11-14: 50 ug via INTRAVENOUS
  Filled 2019-11-14 (×3): qty 2

## 2019-11-14 MED ORDER — DIPHENHYDRAMINE HCL 50 MG/ML IJ SOLN: 12.5000 mg | INTRAMUSCULAR | Status: DC | PRN

## 2019-11-14 MED ORDER — LACTATED RINGERS IV SOLN
500.0000 mL | INTRAVENOUS | Status: DC | PRN
  Administered 2019-11-14 – 2019-11-15 (×2): 500 mL via INTRAVENOUS

## 2019-11-14 MED ORDER — BUPIVACAINE HCL (PF) 0.25 % IJ SOLN
INTRAMUSCULAR | Status: DC | PRN
  Administered 2019-11-14 – 2019-11-15 (×2): 8 mL via EPIDURAL

## 2019-11-14 MED ORDER — OXYTOCIN-SODIUM CHLORIDE 30-0.9 UT/500ML-% IV SOLN
1.0000 m[IU]/min | INTRAVENOUS | Status: DC
  Administered 2019-11-14: 2 m[IU]/min via INTRAVENOUS

## 2019-11-14 NOTE — Progress Notes (Signed)
OB PN:  S: Resting comfortably with epidural  O: BP 131/88   Pulse (!) 131   Temp 98.7 F (37.1 C) (Oral)   Resp 16   Ht 5\' 4"  (1.626 m)   Wt 101.1 kg   LMP 02/10/2019   SpO2 97%   BMI 38.24 kg/m   FHT: 145bpm, moderate variablity,+ accels, no decels Toco: q2-11min SVE: 4/60/-2, IUPC placed  A/P: 19 y.o. G1P0 @ [redacted]w[redacted]d for IOL 1. FWB: Cat. I 2. Labor: continue Pit per protocol, IUPC placed for better titration Pain: continue epidural GBS: positive- Plan to start PCN per protocol  [redacted]w[redacted]d, DO [phone] (cell) [phone] (office)

## 2019-11-14 NOTE — Progress Notes (Signed)
OB PN:  S: Pt sleeping this am  O: BP 139/78    Pulse 67    Temp 98.2 F (36.8 C) (Oral)    Resp 17    Ht 5\' 4"  (1.626 m)    Wt 101.1 kg    LMP 02/10/2019    BMI 38.24 kg/m   FHT: 135bpm, moderate variablity, + accels, no decels Toco: irregular SVE: deferred  A/P: 19 y.o. G1P0 @ [redacted]w[redacted]d for IOL 1. FWB: Cat. I 2. Labor: s/p cytotec #2 this am Pain: IV fentanyl upon request/as needed GBS: positive- plan to start PCN with placement of Cook, AROM or transition to active labor  [redacted]w[redacted]d, DO [phone] (cell) [phone] (office)

## 2019-11-14 NOTE — Anesthesia Preprocedure Evaluation (Signed)
Anesthesia Evaluation  Patient identified by MRN, date of birth, ID band Patient awake    Reviewed: Allergy & Precautions, NPO status , Patient's Chart, lab work & pertinent test results  Airway Mallampati: II  TM Distance: >3 FB Neck ROM: Full    Dental  (+) Teeth Intact, Dental Advisory Given   Pulmonary asthma ,    Pulmonary exam normal breath sounds clear to auscultation       Cardiovascular negative cardio ROS Normal cardiovascular exam Rhythm:Regular Rate:Normal     Neuro/Psych negative neurological ROS     GI/Hepatic negative GI ROS, Neg liver ROS,   Endo/Other  Obesity   Renal/GU negative Renal ROS     Musculoskeletal negative musculoskeletal ROS (+)   Abdominal   Peds  Hematology  (+) Blood dyscrasia, anemia , Plt 309k   Anesthesia Other Findings Day of surgery medications reviewed with the patient.  Reproductive/Obstetrics (+) Pregnancy                             Anesthesia Physical Anesthesia Plan  ASA: II  Anesthesia Plan: Epidural   Post-op Pain Management:    Induction:   PONV Risk Score and Plan: 2 and Treatment may vary due to age or medical condition  Airway Management Planned: Natural Airway  Additional Equipment:   Intra-op Plan:   Post-operative Plan:   Informed Consent: I have reviewed the patients History and Physical, chart, labs and discussed the procedure including the risks, benefits and alternatives for the proposed anesthesia with the patient or authorized representative who has indicated his/her understanding and acceptance.     Dental advisory given  Plan Discussed with:   Anesthesia Plan Comments: (Patient identified. Risks/Benefits/Options discussed with patient including but not limited to bleeding, infection, nerve damage, paralysis, failed block, incomplete pain control, headache, blood pressure changes, nausea, vomiting, reactions  to medication both or allergic, itching and postpartum back pain. Confirmed with bedside nurse the patient's most recent platelet count. Confirmed with patient that they are not currently taking any anticoagulation, have any bleeding history or any family history of bleeding disorders. Patient expressed understanding and wished to proceed. All questions were answered. )        Anesthesia Quick Evaluation

## 2019-11-14 NOTE — Anesthesia Procedure Notes (Signed)
Epidural Patient location during procedure: OB Start time: 11/14/2019 3:21 PM End time: 11/14/2019 3:27 PM  Staffing Anesthesiologist: Cecile Hearing, MD Performed: anesthesiologist   Preanesthetic Checklist Completed: patient identified, IV checked, risks and benefits discussed, monitors and equipment checked, pre-op evaluation and timeout performed  Epidural Patient position: sitting Prep: DuraPrep Patient monitoring: blood pressure and continuous pulse ox Approach: midline Location: L3-L4 Injection technique: LOR air  Needle:  Needle type: Tuohy  Needle gauge: 17 G Needle length: 9 cm Needle insertion depth: 5 cm Catheter size: 19 Gauge Catheter at skin depth: 10 cm Test dose: negative and Other (1% Lidocaine)  Additional Notes Patient identified.  Risk benefits discussed including failed block, incomplete pain control, headache, nerve damage, paralysis, blood pressure changes, nausea, vomiting, reactions to medication both toxic or allergic, and postpartum back pain.  Patient expressed understanding and wished to proceed.  All questions were answered.  Sterile technique used throughout procedure and epidural site dressed with sterile barrier dressing. No paresthesia or other complications noted. The patient did not experience any signs of intravascular injection such as tinnitus or metallic taste in mouth nor signs of intrathecal spread such as rapid motor block. Please see nursing notes for vital signs. Reason for block:procedure for pain

## 2019-11-14 NOTE — Progress Notes (Signed)
OB PN:  S: Pt reporting regular painful contractions  O: BP 137/75   Pulse 72   Temp 98.7 F (37.1 C) (Oral)   Resp 18   Ht 5\' 4"  (1.626 m)   Wt 101.1 kg   LMP 02/10/2019   BMI 38.24 kg/m   FHT: 145bpm, moderate variablity,+ accels, no decels Toco: q2-51min SVE: 2-3/70/-2  A/P: 19 y.o. G1P0 @ [redacted]w[redacted]d for IOL 1. FWB: Cat. I 2. Labor: s/p cytotec #3, plan to transition to Pit per protocol Pain: IV or epidural upon request.  Spoke with family including FOB and mother- they are concerned about potential back pain with epidural.  Encouraged them to speak directly with anesthesia regarding any concerns they may have GBS: positive- Plan to start PCN per protocol  [redacted]w[redacted]d, DO [phone] (cell) [phone] (office)

## 2019-11-15 ENCOUNTER — Encounter (HOSPITAL_COMMUNITY): Payer: Self-pay | Admitting: Obstetrics & Gynecology

## 2019-11-15 LAB — CBC
HCT: 30.2 % — ABNORMAL LOW (ref 36.0–46.0)
Hemoglobin: 9.7 g/dL — ABNORMAL LOW (ref 12.0–15.0)
MCH: 23.3 pg — ABNORMAL LOW (ref 26.0–34.0)
MCHC: 32.1 g/dL (ref 30.0–36.0)
MCV: 72.4 fL — ABNORMAL LOW (ref 80.0–100.0)
Platelets: 262 10*3/uL (ref 150–400)
RBC: 4.17 MIL/uL (ref 3.87–5.11)
RDW: 17.3 % — ABNORMAL HIGH (ref 11.5–15.5)
WBC: 23.8 10*3/uL — ABNORMAL HIGH (ref 4.0–10.5)
nRBC: 0 % (ref 0.0–0.2)

## 2019-11-15 MED ORDER — COCONUT OIL OIL: 1.0000 "application " | TOPICAL_OIL | Status: DC | PRN

## 2019-11-15 MED ORDER — ONDANSETRON HCL 4 MG/2ML IJ SOLN: 4.0000 mg | INTRAMUSCULAR | Status: DC | PRN

## 2019-11-15 MED ORDER — METHYLERGONOVINE MALEATE 0.2 MG/ML IJ SOLN: 0.2000 mg | INTRAMUSCULAR | Status: DC | PRN

## 2019-11-15 MED ORDER — BENZOCAINE-MENTHOL 20-0.5 % EX AERO
1.0000 "application " | INHALATION_SPRAY | CUTANEOUS | Status: DC | PRN
  Administered 2019-11-15: 1 via TOPICAL
  Filled 2019-11-15: qty 56

## 2019-11-15 MED ORDER — WITCH HAZEL-GLYCERIN EX PADS: 1.0000 "application " | MEDICATED_PAD | CUTANEOUS | Status: DC | PRN

## 2019-11-15 MED ORDER — DIPHENHYDRAMINE HCL 25 MG PO CAPS: 25.0000 mg | ORAL_CAPSULE | Freq: Four times a day (QID) | ORAL | Status: DC | PRN

## 2019-11-15 MED ORDER — IBUPROFEN 600 MG PO TABS
600.0000 mg | ORAL_TABLET | Freq: Four times a day (QID) | ORAL | Status: DC
  Administered 2019-11-15 – 2019-11-16 (×6): 600 mg via ORAL
  Filled 2019-11-15 (×8): qty 1

## 2019-11-15 MED ORDER — SIMETHICONE 80 MG PO CHEW: 80.0000 mg | CHEWABLE_TABLET | ORAL | Status: DC | PRN

## 2019-11-15 MED ORDER — METHYLERGONOVINE MALEATE 0.2 MG/ML IJ SOLN
0.2000 mg | Freq: Once | INTRAMUSCULAR | Status: AC
  Administered 2019-11-15: 0.2 mg via INTRAMUSCULAR

## 2019-11-15 MED ORDER — METHYLERGONOVINE MALEATE 0.2 MG PO TABS: 0.2000 mg | ORAL_TABLET | ORAL | Status: DC | PRN

## 2019-11-15 MED ORDER — RHO D IMMUNE GLOBULIN 1500 UNIT/2ML IJ SOSY
300.0000 ug | PREFILLED_SYRINGE | Freq: Once | INTRAMUSCULAR | Status: AC
  Administered 2019-11-15: 300 ug via INTRAVENOUS
  Filled 2019-11-15: qty 2

## 2019-11-15 MED ORDER — ZOLPIDEM TARTRATE 5 MG PO TABS: 5.0000 mg | ORAL_TABLET | Freq: Every evening | ORAL | Status: DC | PRN

## 2019-11-15 MED ORDER — ONDANSETRON HCL 4 MG PO TABS: 4.0000 mg | ORAL_TABLET | ORAL | Status: DC | PRN

## 2019-11-15 MED ORDER — DIBUCAINE (PERIANAL) 1 % EX OINT: 1.0000 "application " | TOPICAL_OINTMENT | CUTANEOUS | Status: DC | PRN

## 2019-11-15 MED ORDER — METHYLERGONOVINE MALEATE 0.2 MG/ML IJ SOLN
INTRAMUSCULAR | Status: AC
  Filled 2019-11-15: qty 1

## 2019-11-15 MED ORDER — ACETAMINOPHEN 325 MG PO TABS: 650.0000 mg | ORAL_TABLET | ORAL | Status: DC | PRN

## 2019-11-15 MED ORDER — PRENATAL MULTIVITAMIN CH
1.0000 | ORAL_TABLET | Freq: Every day | ORAL | Status: DC
  Administered 2019-11-15 – 2019-11-16 (×2): 1 via ORAL
  Filled 2019-11-15 (×2): qty 1

## 2019-11-15 MED ORDER — SENNOSIDES-DOCUSATE SODIUM 8.6-50 MG PO TABS
2.0000 | ORAL_TABLET | ORAL | Status: DC
  Administered 2019-11-16 (×2): 2 via ORAL
  Filled 2019-11-15 (×2): qty 2

## 2019-11-15 MED ORDER — SODIUM CHLORIDE 0.9 % IV SOLN
3.0000 g | Freq: Once | INTRAVENOUS | Status: AC
  Administered 2019-11-15: 3 g via INTRAVENOUS
  Filled 2019-11-15: qty 8

## 2019-11-15 NOTE — Plan of Care (Signed)
Doing well. Progressing in self care, infant feeding and postpartum status.

## 2019-11-15 NOTE — Lactation Note (Signed)
This note was copied from a baby's chart. Lactation Consultation Note  Patient Name: Shari Flores ZOXWR'U Date: 11/15/2019 Reason for consult: Initial assessment;1st time breastfeeding;Term P1, 15 hour term female infant. Infant had one void and 3 stools since birth Tools given: Mom was given DEBP and hand pump by RN, mom's nipples are everted outward. Mom's feeding choice at admission was breastfeeding and mom's current feeding choice is breast and formula feeding. Per mom, infant is now latching without NS and  most feedings are 5 minutes LC discussed with mom breast stimulation, how to keep infant awake to breastfeed longer by doing breast compressions while BF, tickling infant's neck and shoulder and talking to infant. Mom knows to call RN or LC if she needs assistance with latching infant at breast.  LC entered room, mom was pumping only one breast with DEBP and Grandmother was holding infant swaddled in blankets. LC was not able to see a latch due Grandmother giving infant 40 mls of formula at this time. LC discussed infant's small tummy size with Grandmother and infant's tummy is the size of her fist and colostrum is enough for infant. Grandmother now understands if infant is being supplemented infant only needs 5-7 mls first 24 hours of life after latching at breast,  supplemental sheet given to family  LC ask mom did she know how to hand express and mom replied no! Mom  was taught hand expression and easily expressed 12 mls of colostrum. The EBM was placed in two bottle and put in Fridge, mom understands how to warm breast milk she offer the EBM after she latches infant at breast. Mom will do hand on pumping , she will use DEBP and pump both breast and hand express after wards to help establish her milk supply. Mom will continue to BF according to cues, on demand, 8 to 12+ times within 24 hours and not exceed 3 hours without BF infant. Reviewed Baby & Me book's Breastfeeding  Basics.  Mom made aware of O/P services, breastfeeding support groups, community resources, and our phone # for post-discharge questions.     Maternal Data Formula Feeding for Exclusion: No Has patient been taught Hand Expression?: Yes Does the patient have breastfeeding experience prior to this delivery?: No  Feeding Feeding Type: Breast Fed  LATCH Score Latch: Repeated attempts needed to sustain latch, nipple held in mouth throughout feeding, stimulation needed to elicit sucking reflex.  Audible Swallowing: A few with stimulation  Type of Nipple: Everted at rest and after stimulation  Comfort (Breast/Nipple): Soft / non-tender  Hold (Positioning): Assistance needed to correctly position infant at breast and maintain latch.  LATCH Score: 7  Interventions Interventions: Breast feeding basics reviewed;Skin to skin;Breast massage;Hand express;Expressed milk;DEBP  Lactation Tools Discussed/Used Tools: Pump;Nipple Shields Nipple shield size: 20 WIC Program: Yes Pump Review: Setup, frequency, and cleaning;Milk Storage Initiated by:: by RN   Consult Status Consult Status: Follow-up Date: 11/16/19 Follow-up type: In-patient    Danelle Earthly 11/15/2019, 7:19 PM

## 2019-11-15 NOTE — Anesthesia Postprocedure Evaluation (Signed)
Anesthesia Post Note  Patient: Shari Flores  Procedure(s) Performed: AN AD HOC LABOR EPIDURAL     Patient location during evaluation: Mother Baby Anesthesia Type: Epidural Level of consciousness: awake and alert Pain management: pain level controlled Vital Signs Assessment: post-procedure vital signs reviewed and stable Respiratory status: spontaneous breathing, nonlabored ventilation and respiratory function stable Cardiovascular status: stable Postop Assessment: no headache, no backache, epidural receding, no apparent nausea or vomiting, patient able to bend at knees, adequate PO intake and able to ambulate Anesthetic complications: no   No complications documented.  Last Vitals:  Vitals:   11/15/19 0710 11/15/19 1201  BP: 128/65 123/72  Pulse: 78 74  Resp: 18 18  Temp: 36.9 C 36.7 C  SpO2: 100% 100%    Last Pain:  Vitals:   11/15/19 1201  TempSrc: Oral  PainSc: 5    Pain Goal: Patients Stated Pain Goal: 4 (11/15/19 1201)                 [name]

## 2019-11-15 NOTE — Progress Notes (Signed)
Patient has requested several times this am to have her NSL removed. She declines that it is painful. She says it annoys her and wants it out. Discussed potential need if temperature increases again, increased bleeding and potential to need an IV restarted. Patient verbalizes understanding and wants NSL removed. Site intact, pt reports relief having it out.

## 2019-11-16 LAB — RH IG WORKUP (INCLUDES ABO/RH)
ABO/RH(D): A NEG
Fetal Screen: NEGATIVE
Gestational Age(Wks): 39
Unit division: 0

## 2019-11-16 LAB — SURGICAL PATHOLOGY

## 2019-11-16 MED ORDER — FERROUS GLUCONATE 324 (38 FE) MG PO TABS
324.0000 mg | ORAL_TABLET | Freq: Every day | ORAL | Status: DC
  Administered 2019-11-16: 324 mg via ORAL
  Filled 2019-11-16 (×2): qty 1

## 2019-11-16 NOTE — Progress Notes (Signed)
Postpartum Note Day # 1  S:  Patient resting comfortable in bed.  Pain controlled.  Tolerating general diet. + flatus, no BM.  Lochia moderate and improving.  Ambulating without difficulty.  She denies n/v/f/c, SOB, or CP  O: Temp:  [97.8 F (36.6 C)-98.2 F (36.8 C)] 98.2 F (36.8 C) (07/01 1943) Pulse Rate:  [57-74] 57 (07/02 0611) Resp:  [18-20] 18 (07/02 0611) BP: (111-126)/(58-75) 111/58 (07/02 0611) SpO2:  [99 %-100 %] 100 % (07/02 0611)   Gen: A&Ox3, NAD CV: RRR Resp: CTAB Abdomen: soft, NT, ND Uterus: firm, non-tender, below umbilicus Ext: No edema, no calf tenderness bilaterally Labs:  Recent Labs    11/14/19 0100 11/15/19 0640  HGB 10.6* 9.7*    A/P: Pt is a 19 y.o. G1P1001 s/p NSVD, PPD#1  - Pain well controlled -GU: voiding freely -GI: Tolerating general diet -Activity: encouraged sitting up to chair and ambulation as tolerated -Prophylaxis: early ambulation -Anemia: appropriate decline, iron daily  DISPO: continue routine postpartum care  Myna Hidalgo, DO [phone] (cell) [phone] (office)

## 2019-11-16 NOTE — Lactation Note (Signed)
This note was copied from a baby's chart. Lactation Consultation Note  Patient Name: Shari Flores YHCWC'[id number] Date: 11/16/2019 Reason for consult: Follow-up assessment   Baby 36 hours old.  Mother is pumping approx 15 ml and supplementing with formula. Mother denies questions or concerns.     Maternal Data    Feeding Feeding Type: Breast Milk with Formula added Nipple Type: Slow - flow  LATCH Score                   Interventions Interventions: DEBP  Lactation Tools Discussed/Used     Consult Status Consult Status: Follow-up Date: 11/17/19 Follow-up type: In-patient    Dahlia Byes [facility] 11/16/2019, 3:42 PM

## 2019-11-17 MED ORDER — IBUPROFEN 600 MG PO TABS: 600.0000 mg | ORAL_TABLET | Freq: Four times a day (QID) | ORAL | 0 refills | Status: DC | PRN

## 2019-11-17 MED ORDER — ACETAMINOPHEN 325 MG PO TABS: 650.0000 mg | ORAL_TABLET | ORAL | Status: AC | PRN

## 2019-11-17 NOTE — Lactation Note (Signed)
This note was copied from a baby's chart. Lactation Consultation Note  Patient Name: Shari Flores YSAYT'K Date: 11/17/2019 Reason for consult: Follow-up assessment  Mother is a P15, infant is 81  hours old .  Mother reports that she just pumped 90 ml. She gave infant 45 ml.  Mother plans to keep trying to breastfeed; she has NS.  She is planning to purchase a pump today . She has a manual pump for home use at this time. Reviewed milk storage guidelines.   Mother to continue to cue base feed infant and feed at least 8-12 times or more in 24 hours and advised to allow for cluster feeding infant as needed.   Mother to continue to due STS. Mother is aware of available LC services at [facility], BFSG'S, OP Dept, and phone # for questions or concerns about breastfeeding.  Mother receptive to all teaching and plan of care.    Maternal Data    Feeding Feeding Type: Breast Milk Nipple Type: Slow - flow  LATCH Score                   Interventions Interventions: Hand pump;Ice  Lactation Tools Discussed/Used     Consult Status Consult Status: Complete    Shari Flores 11/17/2019, 10:14 AM

## 2019-11-17 NOTE — Discharge Summary (Signed)
Postpartum Discharge Summary  Date of Service updated 11/17/2019     Patient Name: Shari Flores DOB: August 22, 2000 MRN: [id number]  Date of admission: 11/14/2019 Delivery date:11/15/2019  Delivering provider: Janyth Pupa  Date of discharge: 11/17/2019  Admitting diagnosis: Labor and delivery indication for care or intervention [O75.9] Intrauterine pregnancy: [redacted]w[redacted]d    Secondary diagnosis:  Active Problems:   Labor and delivery indication for care or intervention  Additional problems: None    Discharge diagnosis: Term Pregnancy Delivered                                              Post partum procedures:None Augmentation: AROM, Pitocin and Cytotec Complications: None  Hospital course: Induction of Labor With Vaginal Delivery   19y.o. yo G1P1001 at [id number] admitted to the hospital 11/14/2019 for induction of labor.  Indication for induction: Favorable cervix at term.  Patient had an uncomplicated labor course as follows: Membrane Rupture Time/Date: 5:20 PM ,11/14/2019   Delivery Method:Vaginal, Spontaneous  Episiotomy: None  Lacerations:  1st degree  Details of delivery can be found in separate delivery note.  Patient had a routine postpartum course. Patient is discharged home 11/17/19.  Newborn Data: Birth date:11/15/2019  Birth time:3:22 AM  Gender:Female  Living status:Living  Apgars:8 ,9  Weight:3067 g   Magnesium Sulfate received: No BMZ received: No Rhophylac:Yes MMR:No T-DaP:Given prenatally Flu: No Transfusion:No  Physical exam  Vitals:   11/16/19 0611 11/16/19 1449 11/16/19 2217 11/17/19 0555  BP: (!) 111/58 129/73 122/60 133/81  Pulse: (!) 57 68 66 67  Resp: '18 18  18  '$ Temp:  98.2 F (36.8 C) 98.4 F (36.9 C) 98.6 F (37 C)  TempSrc:  Oral Oral Oral  SpO2: 100%  100% 100%  Weight:      Height:       General: alert, cooperative and no distress Lochia: appropriate Uterine Fundus: firm Incision: N/A DVT Evaluation: No evidence of DVT seen on  physical exam. Labs: Lab Results  Component Value Date   WBC 23.8 (H) 11/15/2019   HGB 9.7 (L) 11/15/2019   HCT 30.2 (L) 11/15/2019   MCV 72.4 (L) 11/15/2019   PLT 262 11/15/2019   CMP Latest Ref Rng & Units 07/12/2019  Glucose 70 - 99 mg/dL 73  BUN 6 - 20 mg/dL 7  Creatinine 0.44 - 1.00 mg/dL 0.55  Sodium 135 - 145 mmol/L 135  Potassium 3.5 - 5.1 mmol/L 3.6  Chloride 98 - 111 mmol/L 107  CO2 22 - 32 mmol/L 17(L)  Calcium 8.9 - 10.3 mg/dL 8.7(L)  Total Protein 6.5 - 8.1 g/dL 6.2(L)  Total Bilirubin 0.3 - 1.2 mg/dL 0.4  Alkaline Phos 38 - 126 U/L 62  AST 15 - 41 U/L 15  ALT 0 - 44 U/L 12   Edinburgh Score: Edinburgh Postnatal Depression Scale Screening Tool 11/15/2019  I have been able to laugh and see the funny side of things. (No Data)      After visit meds:  Allergies as of 11/17/2019      Reactions   Mobic [meloxicam] Shortness Of Breath      Medication List    STOP taking these medications   dicyclomine 20 MG tablet Commonly known as: BENTYL   mometasone 220 MCG/INH inhaler Commonly known as: ASMANEX   penicillin v potassium 500 MG tablet Commonly known  as: VEETID     TAKE these medications   acetaminophen 325 MG tablet Commonly known as: Tylenol Take 2 tablets (650 mg total) by mouth every 4 (four) hours as needed (for pain scale < 4).   ferrous sulfate 325 (65 FE) MG tablet Take 325 mg by mouth daily with breakfast.   ibuprofen 600 MG tablet Commonly known as: ADVIL Take 1 tablet (600 mg total) by mouth every 6 (six) hours as needed.   loratadine 10 MG tablet Commonly known as: CLARITIN Take 10 mg by mouth daily as needed for allergies.   ondansetron 4 MG tablet Commonly known as: ZOFRAN Take 4 mg by mouth 3 (three) times daily as needed for nausea.   ProAir HFA 108 (90 Base) MCG/ACT inhaler Generic drug: albuterol Inhale 1-2 puffs into the lungs every 6 (six) hours as needed for wheezing or shortness of breath.        Discharge home in  stable condition Infant Feeding: Breast Infant Disposition:home with mother Discharge instruction: per After Visit Summary and Postpartum booklet. Activity: Advance as tolerated. Pelvic rest for 6 weeks.  Diet: routine diet Anticipated Birth Control: Unsure Postpartum Appointment:2 weeks Additional Postpartum F/U: Postpartum Depression checkup Future Appointments:No future appointments. Follow up Visit:  Follow-up Information    Janyth Pupa, DO. Schedule an appointment as soon as possible for a visit in 3 week(s).   Specialty: Obstetrics and Gynecology Why: postpartum visit with Dr. Tama Headings information: [address] [place] [address] [phone]                   11/17/2019 Christophe Louis, MD

## 2020-05-30 ENCOUNTER — Emergency Department (HOSPITAL_COMMUNITY)
Admission: EM | Admit: 2020-05-30 | Discharge: 2020-05-30 | Disposition: A | Discharge status: Home / Self Care | Payer: Medicaid Other | Attending: Emergency Medicine | Admitting: Emergency Medicine

## 2020-05-30 ENCOUNTER — Encounter (HOSPITAL_COMMUNITY): Payer: Self-pay | Admitting: *Deleted

## 2020-05-30 ENCOUNTER — Other Ambulatory Visit: Payer: Self-pay

## 2020-05-30 DIAGNOSIS — M791 Myalgia, unspecified site: Secondary | ICD-10-CM | POA: Diagnosis present

## 2020-05-30 DIAGNOSIS — U071 COVID-19: Secondary | ICD-10-CM | POA: Insufficient documentation

## 2020-05-30 DIAGNOSIS — Z20822 Contact with and (suspected) exposure to covid-19: Secondary | ICD-10-CM

## 2020-05-30 DIAGNOSIS — B349 Viral infection, unspecified: Secondary | ICD-10-CM

## 2020-05-30 LAB — CBC
HCT: 38.1 % (ref 36.0–46.0)
Hemoglobin: 11.9 g/dL — ABNORMAL LOW (ref 12.0–15.0)
MCH: 21.8 pg — ABNORMAL LOW (ref 26.0–34.0)
MCHC: 31.2 g/dL (ref 30.0–36.0)
MCV: 69.7 fL — ABNORMAL LOW (ref 80.0–100.0)
Platelets: 381 10*3/uL (ref 150–400)
RBC: 5.47 MIL/uL — ABNORMAL HIGH (ref 3.87–5.11)
RDW: 19.2 % — ABNORMAL HIGH (ref 11.5–15.5)
WBC: 9.1 10*3/uL (ref 4.0–10.5)
nRBC: 0 % (ref 0.0–0.2)

## 2020-05-30 LAB — COMPREHENSIVE METABOLIC PANEL
ALT: 17 U/L (ref 0–44)
AST: 19 U/L (ref 15–41)
Albumin: 4.2 g/dL (ref 3.5–5.0)
Alkaline Phosphatase: 79 U/L (ref 38–126)
Anion gap: 9 (ref 5–15)
BUN: 8 mg/dL (ref 6–20)
CO2: 23 mmol/L (ref 22–32)
Calcium: 9.2 mg/dL (ref 8.9–10.3)
Chloride: 104 mmol/L (ref 98–111)
Creatinine, Ser: 0.84 mg/dL (ref 0.44–1.00)
GFR, Estimated: >60 mL/min (ref >60)
Glucose, Bld: 92 mg/dL (ref 70–99)
Potassium: 4 mmol/L (ref 3.5–5.1)
Sodium: 136 mmol/L (ref 135–145)
Total Bilirubin: 0.5 mg/dL (ref 0.3–1.2)
Total Protein: 8 g/dL (ref 6.5–8.1)

## 2020-05-30 LAB — LIPASE, BLOOD: Lipase: 24 U/L (ref 11–51)

## 2020-05-30 LAB — I-STAT BETA HCG BLOOD, ED (MC, WL, AP ONLY): I-stat hCG, quantitative: <5 m[IU]/mL (ref <5)

## 2020-05-30 LAB — SARS CORONAVIRUS 2 (TAT 6-24 HRS): SARS Coronavirus 2: POSITIVE — AB

## 2020-05-30 MED ORDER — ONDANSETRON 4 MG PO TBDP: 4.0000 mg | ORAL_TABLET | Freq: Three times a day (TID) | ORAL | 0 refills | Status: DC | PRN

## 2020-05-30 NOTE — ED Provider Notes (Signed)
[facility]-EMERGENCY DEPT Provider Note   CSN: [id number] Arrival date & time: 05/30/20  0912     History Chief Complaint  Patient presents with  . Abdominal Pain    Shari Flores is a 20 y.o. female who presents to the ED via EMS with complaints of gradual onset, constant, worsening body aches that began last night. Pt also complains of subjective fevers, chills, slight dry cough. She states she had 2-3 episodes of NBNB emesis however states she thinks it's due to taking Nyquil that did not agree with her. She did have some mild diarrhea yesterday. Reports she was having some upper abdominal pain however this has seemed to subside. She has had positive COVID exposure with someone at work and is unvaccinated. Pt denies shortness of breath, chest pain, pelvic pain, urinary sx, or any other associated symptoms.   The history is provided by the patient and medical records.       Past Medical History:  Diagnosis Date  . Asthma    Exercise & allergy related, Last inhaler use 11/11/19    Patient Active Problem List   Diagnosis Date Noted  . Labor and delivery indication for care or intervention 11/14/2019  . Mild persistent chronic asthma without complication 04/30/2014    Past Surgical History:  Procedure Laterality Date  . NO PAST SURGERIES       OB History    Gravida  1   Para  1   Term  1   Preterm      AB      Living  1     SAB      IAB      Ectopic      Multiple  0   Live Births  1           Family History  Problem Relation Age of Onset  . Diabetes Maternal Grandfather   . Heart attack Maternal Grandfather   . Diabetes Mother   . Hypothyroidism Father     Social History   Tobacco Use  . Smoking status: Never Smoker  . Smokeless tobacco: Never Used  Substance Use Topics  . Alcohol use: Never    Alcohol/week: 0.0 standard drinks  . Drug use: No    Home Medications Prior to Admission medications   Medication  Sig Start Date End Date Taking? Authorizing Provider  ondansetron (ZOFRAN ODT) 4 MG disintegrating tablet Take 1 tablet (4 mg total) by mouth every 8 (eight) hours as needed for nausea or vomiting. 05/30/20  Yes Arihaan Bellucci, PA-C  acetaminophen (TYLENOL) 325 MG tablet Take 2 tablets (650 mg total) by mouth every 4 (four) hours as needed (for pain scale < 4). 11/17/19   Gerald Leitz, MD  ferrous sulfate 325 (65 FE) MG tablet Take 325 mg by mouth daily with breakfast.    [provider]  ibuprofen (ADVIL) 600 MG tablet Take 1 tablet (600 mg total) by mouth every 6 (six) hours as needed. 11/17/19   Gerald Leitz, MD  loratadine (CLARITIN) 10 MG tablet Take 10 mg by mouth daily as needed for allergies.    [provider]  ondansetron (ZOFRAN) 4 MG tablet Take 4 mg by mouth 3 (three) times daily as needed for nausea. 09/18/19   [provider]  PROAIR HFA 108 (90 BASE) MCG/ACT inhaler Inhale 1-2 puffs into the lungs every 6 (six) hours as needed for wheezing or shortness of breath.  03/28/14   [provider]  Allergies    Mobic [meloxicam]  Review of Systems   Review of Systems  Constitutional: Positive for chills, fatigue and fever.  Respiratory: Positive for cough. Negative for shortness of breath.   Cardiovascular: Negative for chest pain.  Gastrointestinal: Positive for abdominal pain, diarrhea, nausea and vomiting.  Musculoskeletal: Positive for myalgias.  All other systems reviewed and are negative.   Physical Exam Updated Vital Signs BP 127/83 (BP Location: Right Arm)   Pulse 82   Temp 98.5 F (36.9 C) (Oral)   Resp 16   Ht 5\' 5"  (1.651 m)   Wt 97.5 kg   SpO2 95%   BMI 35.78 kg/m   Physical Exam Vitals and nursing note reviewed.  Constitutional:      Appearance: She is obese. She is not ill-appearing or diaphoretic.  HENT:     Head: Normocephalic and atraumatic.  Eyes:     Conjunctiva/sclera: Conjunctivae normal.  Cardiovascular:     Rate  and Rhythm: Normal rate and regular rhythm.     Heart sounds: Normal heart sounds.  Pulmonary:     Effort: Pulmonary effort is normal.     Breath sounds: Normal breath sounds. No wheezing, rhonchi or rales.     Comments: Speaking in full sentences without difficulty. Satting 100% on RA. LCTAB.  Abdominal:     General: Abdomen is flat.     Palpations: Abdomen is soft.     Tenderness: There is abdominal tenderness in the epigastric area. There is no right CVA tenderness, left CVA tenderness, guarding or rebound.  Musculoskeletal:     Cervical back: Neck supple.  Skin:    General: Skin is warm and dry.  Neurological:     Mental Status: She is alert.     ED Results / Procedures / Treatments   Labs (all labs ordered are listed, but only abnormal results are displayed) Labs Reviewed  CBC - Abnormal; Notable for the following components:      Result Value   RBC 5.47 (*)    Hemoglobin 11.9 (*)    MCV 69.7 (*)    MCH 21.8 (*)    RDW 19.2 (*)    All other components within normal limits  SARS CORONAVIRUS 2 (TAT 6-24 HRS)  LIPASE, BLOOD  COMPREHENSIVE METABOLIC PANEL  I-STAT BETA HCG BLOOD, ED ([facility], [facility], [facility] ONLY)    EKG None  Radiology No results found.  Procedures Procedures (including critical care time)  Medications Ordered in ED Medications - No data to display  ED Course  I have reviewed the triage vital signs and the nursing notes.  Pertinent labs & imaging results that were available during my care of the patient were reviewed by me and considered in my medical decision making (see chart for details).    MDM Rules/Calculators/A&P                          20 year old female who presents to the ED today with complaint of body aches that started yesterday with associated cough, subjective fevers, chills, abdominal pain, 2 episodes of nonbloody nonbilious emesis, diarrhea yesterday.  Most of her symptoms have resolved however continues to have fatigue and body aches  prompting her to call EMS today.  On arrival to the ED patient is afebrile, nontachycardic and nontachypneic and appears to be in no acute distress.  She does report positive COVID exposure with an individual at work and she is unvaccinated.  Lab work was obtained  while patient was in the waiting room including CBC, CMP, lipase, beta-hCG.  All are unremarkable at this time.  CBC without leukocytosis and hemoglobin stable at 11.9.  CMP without electrolyte abnormalities, LFTs all unremarkable and within normal limits.  Lipase 24.  Beta hCG negative.  On exam patient has some mild epigastric abdominal tenderness palpation without rebound or guarding, no lower abdominal tenderness palpation.  Doubt acute abdomen at this time.  She denies any urinary symptoms.  She is speaking in full sentences without difficulty and satting 95 to 100% on room air at rest.  Her lungs are clear to auscultation bilaterally.  Suspect her symptoms are all likely related to COVID-19.  I do not feel she needs additional imaging or labs at this time.  She does not appear dehydrated on exam.  We will plan to fluid challenge and if able to tolerate p.o. do not feel patient needs IV fluids at this time.  We will plan to swab for COVID.  Patient will be discharged if able to tolerate p.o.   On reevaluation pt resting comfortably. Will discharge at this time. She does report her coworker was awaiting their results and has now tested positive. Suspect pt will be positive too. Will discharge with zofran PRN for nausea. Strict return precautions discussed. Pt is in agreement with plan and stable for discharge home.   This note was prepared using Dragon voice recognition software and may include unintentional dictation errors due to the inherent limitations of voice recognition software.  Shari Flores was evaluated in Emergency Department on 05/30/2020 for the symptoms described in the history of present illness. She was evaluated in the  context of the global COVID-19 pandemic, which necessitated consideration that the patient might be at risk for infection with the SARS-CoV-2 virus that causes COVID-19. Institutional protocols and algorithms that pertain to the evaluation of patients at risk for COVID-19 are in a state of rapid change based on information released by regulatory bodies including the CDC and federal and state organizations. These policies and algorithms were followed during the patient's care in the ED.   Final Clinical Impression(s) / ED Diagnoses Final diagnoses:  Exposure to COVID-19 virus  Viral illness    Rx / DC Orders ED Discharge Orders         Ordered    ondansetron (ZOFRAN ODT) 4 MG disintegrating tablet  Every 8 hours PRN        05/30/20 1223           Discharge Instructions     Please pick up nausea medication and take as needed Self isolate until you receive your COVID 19 results. We will call you if you test positive. You can also log into MyChart and check the results that way. If positive you will need to self isolate for 5 days from symptom onset. If you are improving on Day 5 you can return to work the next day/resume normal daily activity while wearing a mask for an additional 5 days when you are around anyone.  If not improving you will need to self isolate for an additional 5 days.   Continue taking OTC medications as needed. You can take Ibuprofen for body aches and Tylenol as needed for fevers > 100.4. Drink plenty of fluids to stay hydrated.   Follow up with your PCP Regarding your ED visit Return to the ED IMMEDIATELY for any worsening symptoms including severe chest pain, shortness of breath, lips/fingers turning blue, passing out, inability  to awaken easily, new onset confusion, or any other new/concerning symptoms.        Tanda Rockers, PA-C 05/30/20 1230    Vanetta Mulders, MD 06/05/20 1756

## 2020-05-30 NOTE — Discharge Instructions (Addendum)
Please pick up nausea medication and take as needed Self isolate until you receive your COVID 19 results. We will call you if you test positive. You can also log into MyChart and check the results that way. If positive you will need to self isolate for 5 days from symptom onset. If you are improving on Day 5 you can return to work the next day/resume normal daily activity while wearing a mask for an additional 5 days when you are around anyone.  If not improving you will need to self isolate for an additional 5 days.   Continue taking OTC medications as needed. You can take Ibuprofen for body aches and Tylenol as needed for fevers > 100.4. Drink plenty of fluids to stay hydrated.   Follow up with your PCP Regarding your ED visit Return to the ED IMMEDIATELY for any worsening symptoms including severe chest pain, shortness of breath, lips/fingers turning blue, passing out, inability to awaken easily, new onset confusion, or any other new/concerning symptoms.

## 2020-05-30 NOTE — ED Notes (Signed)
ED Provider at bedside. 

## 2020-05-30 NOTE — ED Triage Notes (Signed)
BIB EMS with abd pain N/V has myalgia and cough.  [id number]% CBG 121

## 2020-06-14 IMAGING — US US ABDOMEN LIMITED
1 series · 15 of 25 positions shown · non-contrast
Comparison: None.

CLINICAL DATA: Upper abdominal pain

EXAM:
ULTRASOUND ABDOMEN LIMITED RIGHT UPPER QUADRANT

[Series 1: us abdomen limited · 15 of 32 slices shown]
[im 1/32]
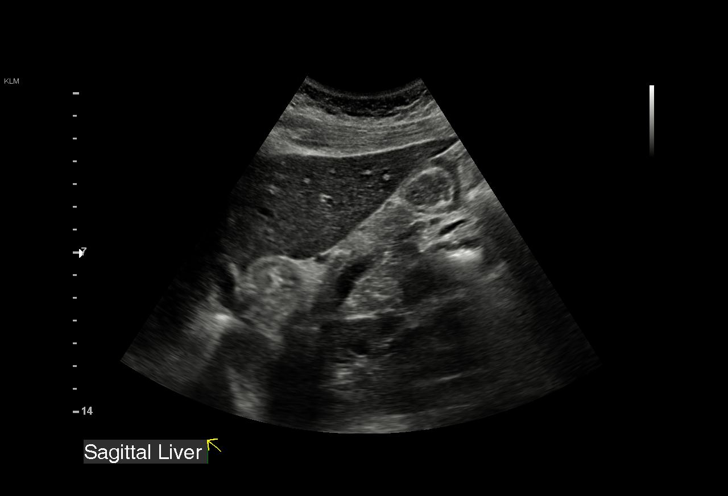
[im 3/32]
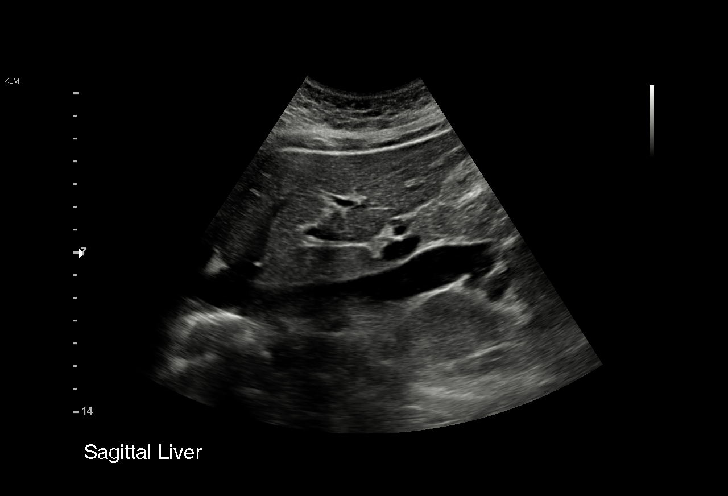
[im 6/32]
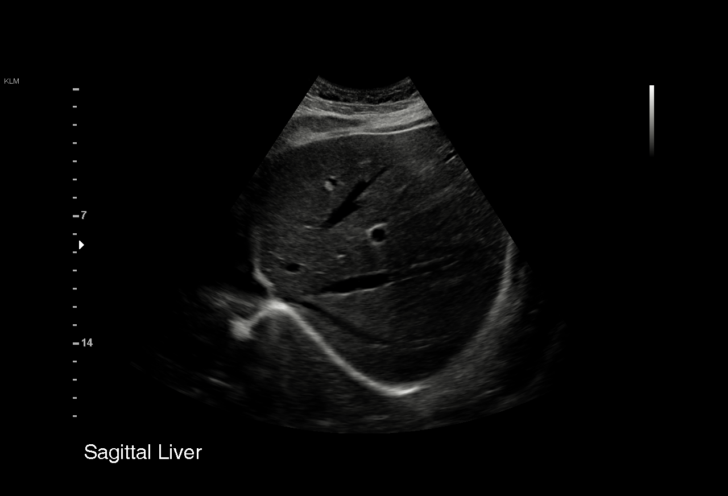
[im 7/32]
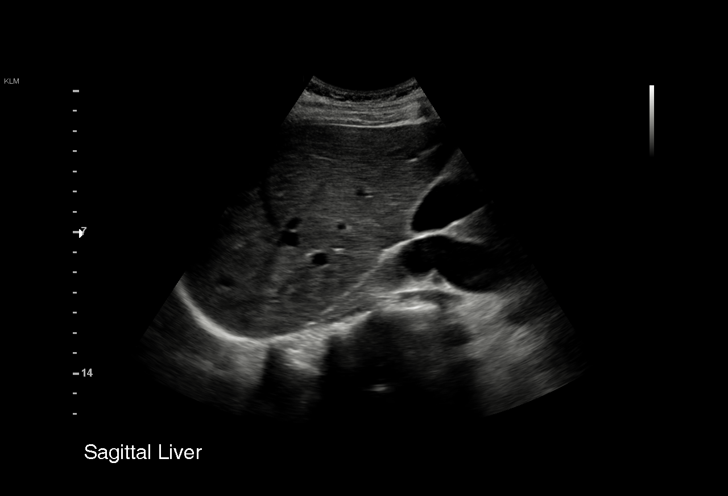
[im 10/32]
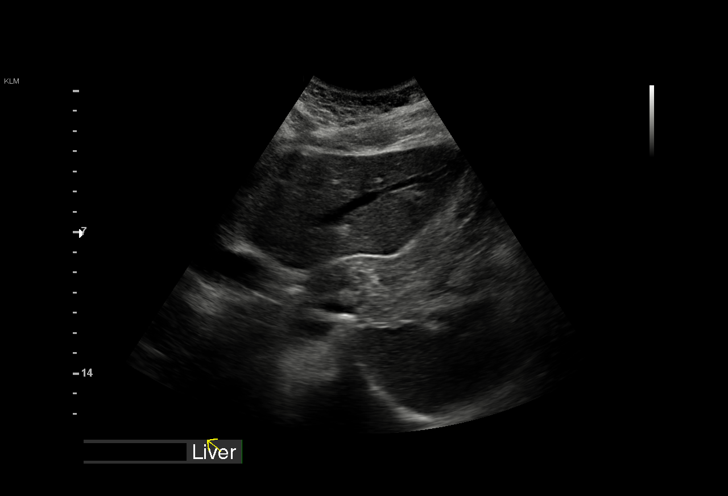
[im 12/32]
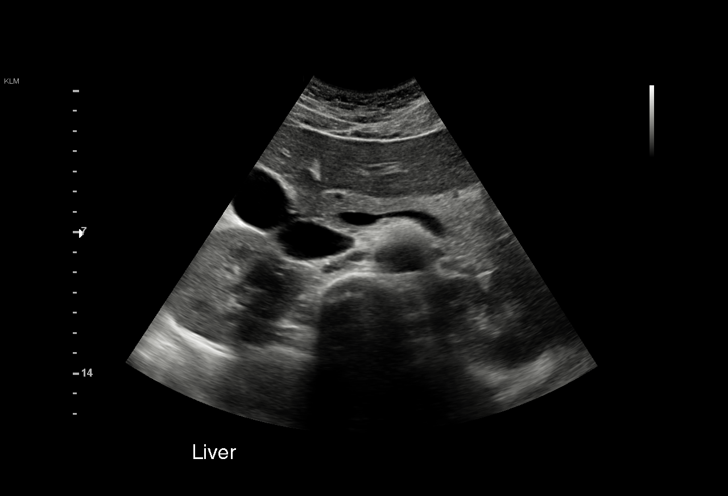
[im 13/32]
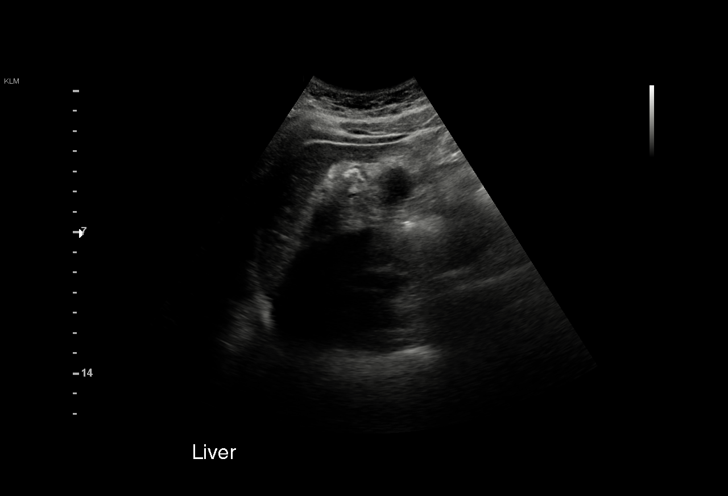
[im 16/32]
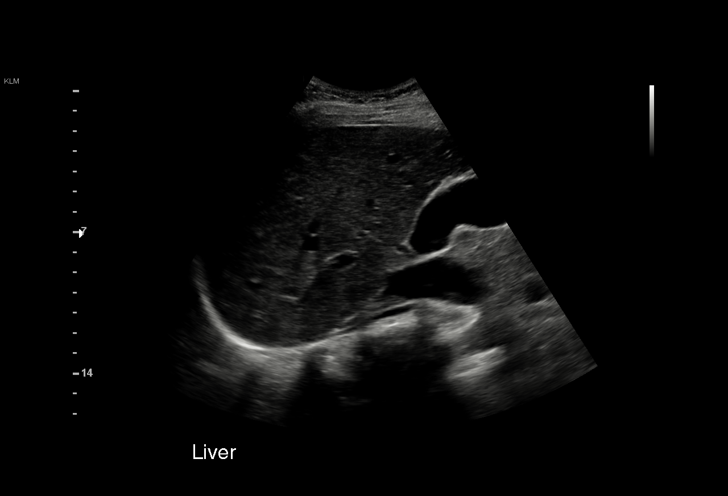
[im 19/32]
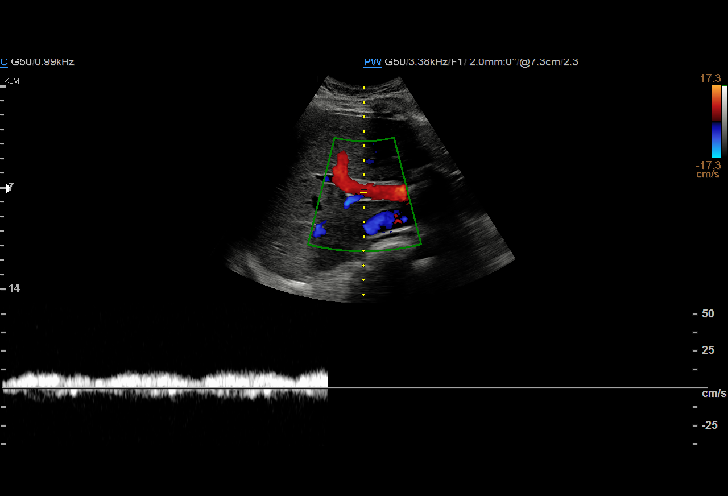
[im 20/32]
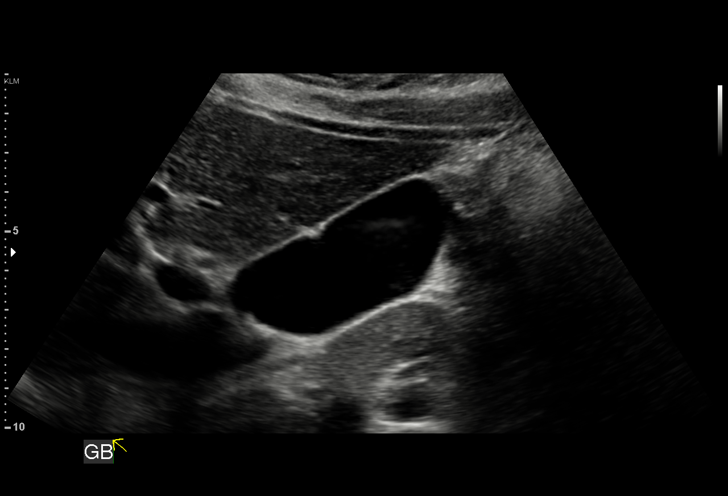
[im 22/32]
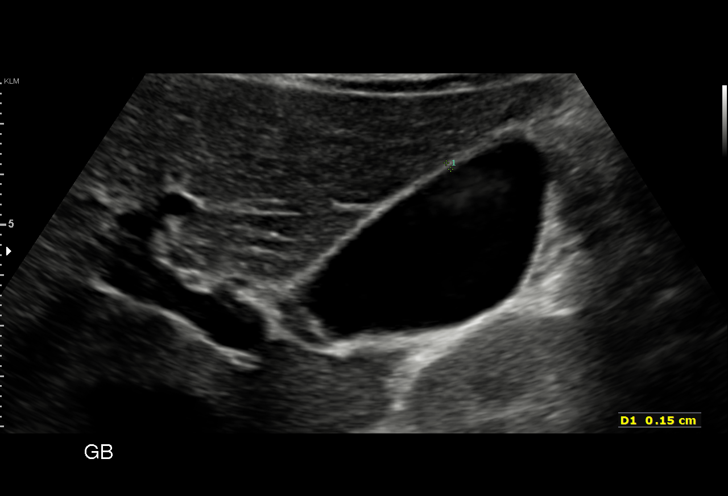
[im 25/32]
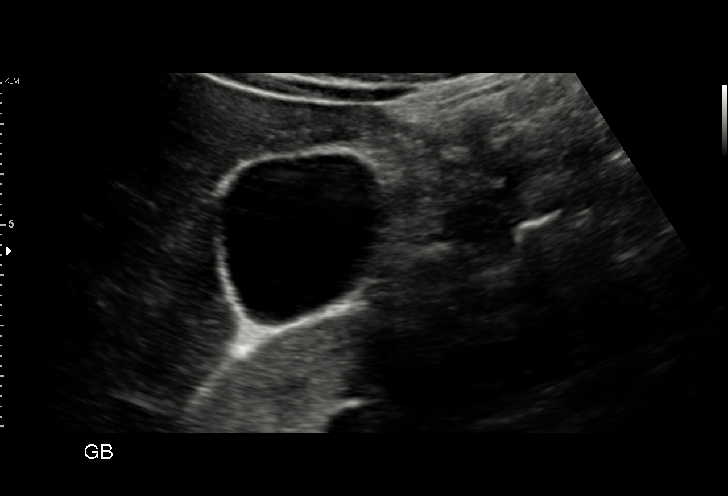
[im 26/32]
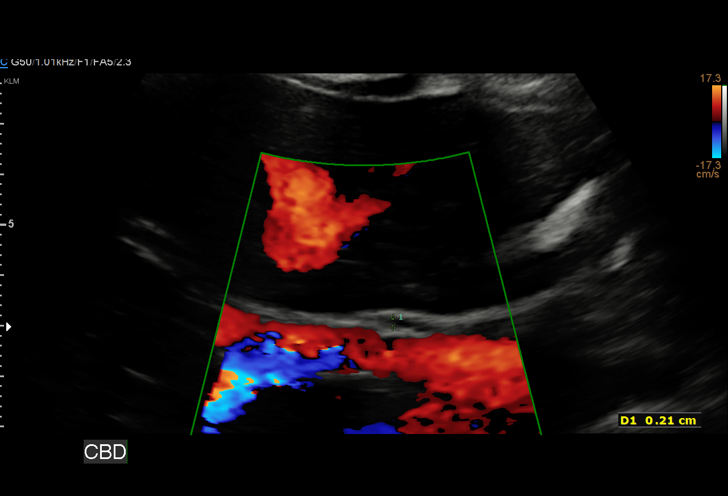
[im 29/32]
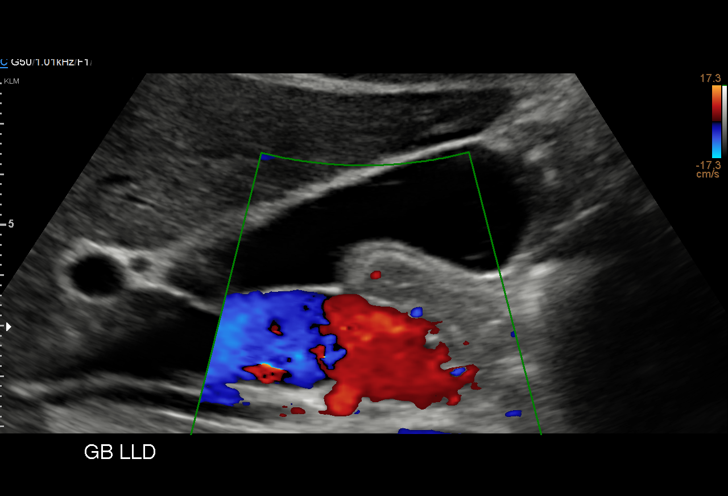
[im 32/32]
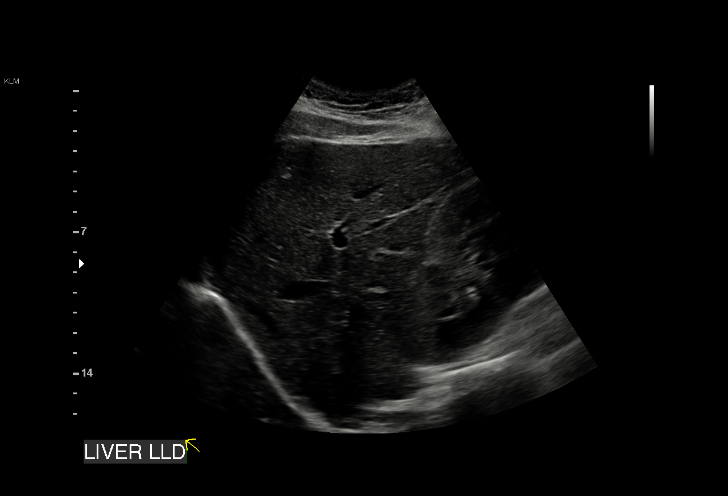

[15 of 25 positions shown; findings below may reference images not displayed]

FINDINGS: Gallbladder:

No gallstones or wall thickening visualized. There is no
pericholecystic fluid. No sonographic Murphy sign noted by
sonographer.

Common bile duct:

Diameter: 2 mm. No intrahepatic or extrahepatic biliary duct
dilatation.

Liver:

No focal lesion identified. Within normal limits in parenchymal
echogenicity. Portal vein is patent on color Doppler imaging with
normal direction of blood flow towards the liver.

Other: None.
IMPRESSION: Study within normal limits.

## 2023-08-15 ENCOUNTER — Encounter: Payer: Self-pay | Admitting: Obstetrics and Gynecology

## 2023-08-24 ENCOUNTER — Ambulatory Visit: Admission: EM | Admit: 2023-08-24 | Discharge: 2023-08-24 | Disposition: A | Discharge status: Home / Self Care

## 2023-08-24 DIAGNOSIS — J029 Acute pharyngitis, unspecified: Secondary | ICD-10-CM

## 2023-08-24 DIAGNOSIS — J020 Streptococcal pharyngitis: Secondary | ICD-10-CM

## 2023-08-24 DIAGNOSIS — R21 Rash and other nonspecific skin eruption: Secondary | ICD-10-CM

## 2023-08-24 LAB — POCT RAPID STREP A (OFFICE): Rapid Strep A Screen: POSITIVE — AB

## 2023-08-24 MED ORDER — TRIAMCINOLONE ACETONIDE 0.1 % EX CREA: 1.0000 | TOPICAL_CREAM | Freq: Three times a day (TID) | CUTANEOUS | 0 refills | Status: AC | PRN

## 2023-08-24 MED ORDER — AMOXICILLIN 500 MG PO CAPS
500.0000 mg | ORAL_CAPSULE | Freq: Two times a day (BID) | ORAL | 0 refills | Status: AC
Start: 2023-08-24 — End: 2023-09-03

## 2023-08-24 MED ORDER — PREDNISONE 20 MG PO TABS: 40.0000 mg | ORAL_TABLET | Freq: Every day | ORAL | 0 refills | Status: DC

## 2023-08-24 NOTE — Discharge Instructions (Addendum)
 Highly suspect the rash on your fingers related to a metal sensitivity.  Would recommend if you wear any jewelry and develop any itching or rash that you remove immediately to avoid developing a skin rash outbreak.  Treating with prednisone 40 mg once daily for 5 days for the rash on your finger.  This will also help with your throat pain related to strep.  I am sending 1 cream as a steroid cream that will help with the superficial itching you are experiencing from the skin rash.  For your strep throat infection I am treating with amoxicillin 500 mg twice daily for 10 days.  After being on antibiotics for 48 hours discard your toothbrush and use a new one to prevent infecting yourself with strep.  Hold breastfeeding (pump and dump) while taking prednisone.

## 2023-08-24 NOTE — ED Provider Notes (Signed)
 [facility]    CSN: [id number] Arrival date & time: 08/24/23  1434      History   Chief Complaint Chief Complaint  Patient presents with   Sore Throat   Nasal Congestion   Rash    HPI Shari Flores is a 23 y.o. female.  Presents today with a 1 day history of sore throat and nasal congestion.  Patient is not having fever, cough, body aches or any known flulike symptoms.  Patient is prone to strep throat and that is her main concern today. Patient is also concerned about a rash that she has had on her right fourth finger for several months.  No prior evaluation.  No known precipitant.  Patient does have a history of chronic asthma however has no known history of eczema.   Past Medical History:  Diagnosis Date   Asthma    Exercise & allergy related, Last inhaler use 11/11/19    Patient Active Problem List   Diagnosis Date Noted   Normal labor 05/14/2022   Labor and delivery indication for care or intervention 11/14/2019   Mild persistent chronic asthma without complication 04/30/2014   Chronic asthma 04/30/2014    Past Surgical History:  Procedure Laterality Date   NO PAST SURGERIES      OB History     Gravida  1   Para  1   Term  1   Preterm      AB      Living  1      SAB      IAB      Ectopic      Multiple  0   Live Births  1            Home Medications    Prior to Admission medications   Medication Sig Start Date End Date Taking? Authorizing Provider  amoxicillin (AMOXIL) 500 MG capsule Take 1 capsule (500 mg total) by mouth 2 (two) times daily for 10 days. 08/24/23 09/03/23 Yes Bing Neighbors, NP  benzoyl peroxide 5 % external liquid Wash affected external area daily as directed 07/06/22  Yes [provider]  Clindamycin-Benzoyl Per, Refr, gel Apply topically. 07/06/22  Yes [provider]  norethindrone (MICRONOR) 0.35 MG tablet Take 1 tablet by mouth daily. 07/06/22  Yes [provider]   predniSONE (DELTASONE) 20 MG tablet Take 2 tablets (40 mg total) by mouth daily with breakfast. 08/24/23  Yes Bing Neighbors, NP  triamcinolone cream (KENALOG) 0.1 % Apply 1 Application topically 3 (three) times daily as needed (skin rash). 08/24/23  Yes Bing Neighbors, NP  acetaminophen (TYLENOL) 325 MG tablet Take 2 tablets (650 mg total) by mouth every 4 (four) hours as needed (for pain scale < 4). 11/17/19   Gerald Leitz, MD  ferrous sulfate 325 (65 FE) MG tablet Take 325 mg by mouth daily with breakfast.    [provider]  ibuprofen (ADVIL) 600 MG tablet Take 1 tablet (600 mg total) by mouth every 6 (six) hours as needed. 11/17/19   Gerald Leitz, MD  loratadine (CLARITIN) 10 MG tablet Take 10 mg by mouth daily as needed for allergies.    [provider]  ondansetron (ZOFRAN ODT) 4 MG disintegrating tablet Take 1 tablet (4 mg total) by mouth every 8 (eight) hours as needed for nausea or vomiting. 05/30/20   Venter, Margaux, PA-C  ondansetron (ZOFRAN) 4 MG tablet Take 4 mg by mouth 3 (three) times daily as needed  for nausea. 09/18/19   [provider]  PROAIR HFA 108 (90 BASE) MCG/ACT inhaler Inhale 1-2 puffs into the lungs every 6 (six) hours as needed for wheezing or shortness of breath.  03/28/14   [provider]    Family History Family History  Problem Relation Age of Onset   Diabetes Maternal Grandfather    Heart attack Maternal Grandfather    Diabetes Mother    Hypothyroidism Father     Social History Social History   Tobacco Use   Smoking status: Never   Smokeless tobacco: Never  Substance Use Topics   Alcohol use: Never    Alcohol/week: 0.0 standard drinks of alcohol   Drug use: No     Allergies   Meloxicam   Review of Systems Review of Systems  Skin:  Positive for rash.     Physical Exam Triage Vital Signs ED Triage Vitals  Encounter Vitals Group     BP 08/24/23 1541 136/81     Systolic BP Percentile --      Diastolic BP  Percentile --      Pulse Rate 08/24/23 1541 75     Resp 08/24/23 1541 16     Temp 08/24/23 1541 97.9 F (36.6 C)     Temp Source 08/24/23 1541 Oral     SpO2 08/24/23 1541 98 %     Weight --      Height --      Head Circumference --      Peak Flow --      Pain Score 08/24/23 1539 7     Pain Loc --      Pain Education --      Exclude from Growth Chart --    No data found.  Updated Vital Signs BP 136/81 (BP Location: Left Arm)   Pulse 75   Temp 97.9 F (36.6 C) (Oral)   Resp 16   LMP 08/14/2023 (Approximate)   SpO2 98%   Breastfeeding Yes   Visual Acuity Right Eye Distance:   Left Eye Distance:   Bilateral Distance:    Right Eye Near:   Left Eye Near:    Bilateral Near:     Physical Exam Vitals reviewed.  Constitutional:      Appearance: She is well-developed.  HENT:     Head: Normocephalic and atraumatic.     Nose: Congestion present. No rhinorrhea.     Mouth/Throat:     Mouth: No oral lesions.     Pharynx: Pharyngeal swelling, oropharyngeal exudate, posterior oropharyngeal erythema and uvula swelling present.  Eyes:     Conjunctiva/sclera: Conjunctivae normal.     Pupils: Pupils are equal, round, and reactive to light.  Cardiovascular:     Rate and Rhythm: Normal rate and regular rhythm.  Pulmonary:     Effort: Pulmonary effort is normal.     Breath sounds: Normal breath sounds.  Skin:    General: Skin is warm and dry.  Neurological:     Mental Status: She is alert.    UC Treatments / Results  Labs (all labs ordered are listed, but only abnormal results are displayed) Labs Reviewed  POCT RAPID STREP A (OFFICE) - Abnormal; Notable for the following components:      Result Value   Rapid Strep A Screen Positive (*)    All other components within normal limits    EKG   Radiology No results found.  Procedures Procedures (including critical care time)  Medications Ordered in UC Medications -  No data to display  Initial Impression /  Assessment and Plan / UC Course  I have reviewed the triage vital signs and the nursing notes.  Pertinent labs & imaging results that were available during my care of the patient were reviewed by me and considered in my medical decision making (see chart for details).   Acute pharyngitis treatment with amoxicillin 500 mg twice daily for 10 days, rapid strep is positive. Longstanding rash on fourth right ring finger suspect this is related to metal sensitivity as patient previously had had a ring on her finger prior to the onset of the rash.  Treating with a burst of prednisone and topical triamcinolone cream.  Patient encouraged to caution herself if she wears any other metal jewelry if any itching develops to discontinue wear of jewelry.  Turn precautions given if symptoms worsen or do not improve. Final Clinical Impressions(s) / UC Diagnoses   Final diagnoses:  Acute infective pharyngitis due to Streptococcus species  Rash of finger     Discharge Instructions      Highly suspect the rash on your fingers related to a metal sensitivity.  Would recommend if you wear any jewelry and develop any itching or rash that you remove immediately to avoid developing a skin rash outbreak.  Treating with prednisone 40 mg once daily for 5 days for the rash on your finger.  This will also help with your throat pain related to strep.  I am sending 1 cream as a steroid cream that will help with the superficial itching you are experiencing from the skin rash.  For your strep throat infection I am treating with amoxicillin 500 mg twice daily for 10 days.  After being on antibiotics for 48 hours discard your toothbrush and use a new one to prevent infecting yourself with strep.  Hold breastfeeding (pump and dump) while taking prednisone.      ED Prescriptions     Medication Sig Dispense Auth. Provider   amoxicillin (AMOXIL) 500 MG capsule Take 1 capsule (500 mg total) by mouth 2 (two) times daily for 10  days. 20 capsule Bing Neighbors, NP   predniSONE (DELTASONE) 20 MG tablet Take 2 tablets (40 mg total) by mouth daily with breakfast. 10 tablet Bing Neighbors, NP   triamcinolone cream (KENALOG) 0.1 % Apply 1 Application topically 3 (three) times daily as needed (skin rash). 160 g Bing Neighbors, NP      PDMP not reviewed this encounter.   Bing Neighbors, NP 08/24/23 1700

## 2023-08-24 NOTE — ED Triage Notes (Signed)
 Pt presents with sore throat and nasal congestion x 1 days. Pt denies emesis and diarrhea.   Pt also complains of a rash on her right hand ring finger x several months.

## 2023-10-26 ENCOUNTER — Encounter: Payer: Self-pay | Admitting: Obstetrics and Gynecology

## 2023-10-26 ENCOUNTER — Other Ambulatory Visit (HOSPITAL_COMMUNITY)
Admission: RE | Admit: 2023-10-26 | Discharge: 2023-10-26 | Disposition: A | Discharge status: Home / Self Care | Source: Ambulatory Visit | Attending: Obstetrics and Gynecology | Admitting: Obstetrics and Gynecology

## 2023-10-26 ENCOUNTER — Ambulatory Visit (INDEPENDENT_AMBULATORY_CARE_PROVIDER_SITE_OTHER): Admitting: Obstetrics and Gynecology

## 2023-10-26 VITALS — BP 118/74 | HR 78 | Temp 98.6°F | Ht 64.75 in | Wt 213.0 lb

## 2023-10-26 DIAGNOSIS — N898 Other specified noninflammatory disorders of vagina: Secondary | ICD-10-CM

## 2023-10-26 DIAGNOSIS — Z01419 Encounter for gynecological examination (general) (routine) without abnormal findings: Secondary | ICD-10-CM | POA: Insufficient documentation

## 2023-10-26 DIAGNOSIS — Z124 Encounter for screening for malignant neoplasm of cervix: Secondary | ICD-10-CM | POA: Diagnosis present

## 2023-10-26 DIAGNOSIS — Z113 Encounter for screening for infections with a predominantly sexual mode of transmission: Secondary | ICD-10-CM | POA: Insufficient documentation

## 2023-10-26 DIAGNOSIS — Z23 Encounter for immunization: Secondary | ICD-10-CM | POA: Diagnosis not present

## 2023-10-26 DIAGNOSIS — Z3009 Encounter for other general counseling and advice on contraception: Secondary | ICD-10-CM | POA: Diagnosis not present

## 2023-10-26 DIAGNOSIS — Z13220 Encounter for screening for lipoid disorders: Secondary | ICD-10-CM

## 2023-10-26 DIAGNOSIS — N760 Acute vaginitis: Secondary | ICD-10-CM

## 2023-10-26 DIAGNOSIS — Z1331 Encounter for screening for depression: Secondary | ICD-10-CM

## 2023-10-26 DIAGNOSIS — Z131 Encounter for screening for diabetes mellitus: Secondary | ICD-10-CM | POA: Diagnosis not present

## 2023-10-26 LAB — WET PREP FOR TRICH, YEAST, CLUE

## 2023-10-26 MED ORDER — METRONIDAZOLE 500 MG PO TABS: 500.0000 mg | ORAL_TABLET | Freq: Two times a day (BID) | ORAL | 0 refills | Status: DC

## 2023-10-26 MED ORDER — METRONIDAZOLE 0.75 % VA GEL: 1.0000 | Freq: Every day | VAGINAL | 0 refills | Status: AC

## 2023-10-26 MED ORDER — ETONOGESTREL-ETHINYL ESTRADIOL 0.12-0.015 MG/24HR VA RING: 1.0000 | VAGINAL_RING | VAGINAL | 4 refills | Status: AC

## 2023-10-26 NOTE — Progress Notes (Signed)
 23 y.o. [id number] female with hx of CD x2 here for annual exam. Single.  Patient's last menstrual period was 09/30/2023 (exact date). Period Duration (Days): 5 Period Pattern: Regular Menstrual Flow: Moderate Menstrual Control: Maxi pad Dysmenorrhea: (!) Moderate Dysmenorrhea Symptoms: Cramping  She wants std testing & would like to discuss birth control options . No hx of DVT, HTN, migraine with aura.  Abnormal bleeding: none Pelvic discharge or pain: none Breast mass, nipple discharge or skin changes : none  Sexually active: yes, 71 female partner over the past year Birth control: condoms sometimes Last PAP: No results found for: DIAGPAP, HPVHIGH, ADEQPAP Gardasil: no  Exercising: no Smoker: no, social smoking, ~2x/wk  [employer] Visit [facility]  PHQ-2 Total Score 0         GYN HISTORY: No sig history  OB History  Gravida Para Term Preterm AB Living  3 2 2  1 2   SAB IAB Ectopic Multiple Live Births   1  0 2    # Outcome Date GA Lbr Len/2nd Weight Sex Type Anes PTL Lv  3 Term 11/15/19 [redacted]w[redacted]d 11:10 / 00:42 6 lb 12.2 oz (3.067 kg) F Vag-Spont EPI  LIV  2 IAB           1 Term            Past Medical History:  Diagnosis Date   Asthma    Exercise & allergy related, Last inhaler use 11/11/19   Asthma    Blood transfusion without reported diagnosis    STD (sexually transmitted disease)    chlamydia treated   Past Surgical History:  Procedure Laterality Date   CESAREAN SECTION     NO PAST SURGERIES     Current Outpatient Medications on File Prior to Visit  Medication Sig Dispense Refill   acetaminophen  (TYLENOL ) 325 MG tablet Take 2 tablets (650 mg total) by mouth every 4 (four) hours as needed (for pain scale < 4). 30 tablet    benzoyl peroxide 5 % external liquid Wash affected external area daily as directed     Clindamycin-Benzoyl Per, Refr, gel Apply topically.     ibuprofen  (ADVIL ) 600 MG  tablet Take 1 tablet (600 mg total) by mouth every 6 (six) hours as needed. 30 tablet 0   loratadine (CLARITIN) 10 MG tablet Take 10 mg by mouth daily as needed for allergies.     ondansetron  (ZOFRAN  ODT) 4 MG disintegrating tablet Take 1 tablet (4 mg total) by mouth every 8 (eight) hours as needed for nausea or vomiting. 20 tablet 0   PROAIR  HFA 108 (90 BASE) MCG/ACT inhaler Inhale 1-2 puffs into the lungs every 6 (six) hours as needed for wheezing or shortness of breath.   0   triamcinolone  cream (KENALOG ) 0.1 % Apply 1 Application topically 3 (three) times daily as needed (skin rash). 160 g 0   No current facility-administered medications on file prior to visit.   Social History   Socioeconomic History   Marital status: Single    Spouse name: Not on file   Number of children: 0   Years of education: Not on file   Highest education level: Not on file  Occupational History   Occupation: student  Tobacco Use   Smoking status: Never   Smokeless tobacco: Never  Substance and Sexual Activity   Alcohol use: Yes    Comment: occ   Drug use: No   Sexual activity:  Yes    Partners: Male    Birth control/protection: Condom  Other Topics Concern   Not on file  Social History Narrative   Not on file   Social Drivers of Health   Financial Resource Strain: Not on file  Food Insecurity: Low Risk  (05/14/2022)   Received from [facility]   Hunger Vital Sign    Worried About Running Out of Food in the Last Year: Never true    Ran Out of Food in the Last Year: Never true  Transportation Needs: Not on file (05/14/2022)  Physical Activity: Not on file  Stress: Not on file  Social Connections: Not on file  Intimate Partner Violence: Not on file   Family History  Problem Relation Age of Onset   Diabetes Maternal Grandfather    Heart attack Maternal Grandfather    Diabetes Mother    Hypothyroidism Father    Allergies  Allergen Reactions   Meloxicam Shortness Of Breath    Other  reaction(s): SOB    PE Today's Vitals   10/26/23 1349  BP: 118/74  Pulse: 78  Temp: 98.6 F (37 C)  TempSrc: Oral  SpO2: 98%  Weight: 213 lb (96.6 kg)  Height: 5' 4.75 (1.645 m)   Body mass index is 35.72 kg/m.  Physical Exam Vitals reviewed. Exam conducted with a chaperone present.  Constitutional:      General: She is not in acute distress.    Appearance: Normal appearance.  HENT:     Head: Normocephalic and atraumatic.     Nose: Nose normal.  Eyes:     Extraocular Movements: Extraocular movements intact.     Conjunctiva/sclera: Conjunctivae normal.  Neck:     Thyroid : No thyroid  mass, thyromegaly or thyroid  tenderness.  Pulmonary:     Effort: Pulmonary effort is normal.  Chest:     Chest wall: No mass or tenderness.  Breasts:    Right: Normal. No swelling, mass, nipple discharge, skin change or tenderness.     Left: Normal. No swelling, mass, nipple discharge, skin change or tenderness.  Abdominal:     General: There is no distension.     Palpations: Abdomen is soft.     Tenderness: There is no abdominal tenderness.  Genitourinary:    General: Normal vulva.     Exam position: Lithotomy position.     Urethra: No prolapse.     Vagina: Normal. No vaginal discharge or bleeding.     Cervix: Normal. No lesion.     Uterus: Normal. Not enlarged and not tender.      Adnexa: Right adnexa normal and left adnexa normal.  Musculoskeletal:        General: Normal range of motion.     Cervical back: Normal range of motion.  Lymphadenopathy:     Upper Body:     Right upper body: No axillary adenopathy.     Left upper body: No axillary adenopathy.     Lower Body: No right inguinal adenopathy. No left inguinal adenopathy.  Skin:    General: Skin is warm and dry.  Neurological:     General: No focal deficit present.     Mental Status: She is alert.  Psychiatric:        Mood and Affect: Mood normal.        Behavior: Behavior normal.      Assessment and Plan:         Well woman exam with routine gynecological exam Assessment & Plan: Cervical cancer screening  performed according to ASCCP guidelines. Labs and immunizations with her primary Encouraged safe sexual practices as indicated Encouraged healthy lifestyle practices with diet and exercise For patients under 50yo, I recommend 1000mg  calcium daily and 600IU of vitamin D daily.   Orders: -     VITAMIN D 25 Hydroxy (Vit-D Deficiency, Fractures) -     Thyroid  Panel With TSH -     CBC -     COMPLETE METABOLIC PANEL WITHOUT GFR  Cervical cancer screening -     Cytology - PAP  Screen for STD (sexually transmitted disease) -     Cytology - PAP -     Hepatitis B surface antigen -     Hepatitis C antibody -     RPR -     HIV Antibody (routine testing w rflx) -     HSV 2 antibody, IgG  Encounter for counseling regarding contraception Assessment & Plan: Contraceptive options were reviewed, including pills, patches, vaginal rings, injection, implant, IUDs and sterilization as indicated. R/b/I/c were reviewed. All questions were answered.  Orders: -     Etonogestrel-Ethinyl Estradiol; Place 1 each vaginally every 28 (twenty-eight) days. Insert vaginally and leave in place for 3 consecutive weeks, then remove for 1 week.  Dispense: 3 each; Refill: 4  Negative depression screening  Vaginal discharge -     WET PREP FOR TRICH, YEAST, CLUE  Screening cholesterol level -     Lipid panel  Diabetes mellitus screening -     Hemoglobin A1C w/out eAG  BV (bacterial vaginosis) -     metroNIDAZOLE; Place 1 Applicatorful vaginally at bedtime for 5 days.  Dispense: 50 g; Refill: 0  Need for HPV vaccination -     HPV 9-valent vaccine,Recombinat   Romaine Closs, MD

## 2023-10-26 NOTE — Assessment & Plan Note (Signed)
Contraceptive options were reviewed, including pills, patches, vaginal rings, injection, implant, IUDs and sterilization as indicated. R/b/I/c were reviewed. All questions were answered.

## 2023-10-26 NOTE — Assessment & Plan Note (Signed)
 Cervical cancer screening performed according to ASCCP guidelines. Labs and immunizations with her primary Encouraged safe sexual practices as indicated Encouraged healthy lifestyle practices with diet and exercise For patients under 25yo, I recommend 1000mg  calcium daily and 600IU of vitamin D daily.

## 2023-10-26 NOTE — Patient Instructions (Addendum)
 Insert annovera ring following next cycle.  Health Maintenance, Female Adopting a healthy lifestyle and getting preventive care are important in promoting health and wellness. Ask your health care provider about: The right schedule for you to have regular tests and exams. Things you can do on your own to prevent diseases and keep yourself healthy. What should I know about diet, weight, and exercise? Eat a healthy diet  Eat a diet that includes plenty of vegetables, fruits, low-fat dairy products, and lean protein. Do not eat a lot of foods that are high in solid fats, added sugars, or sodium. Maintain a healthy weight Body mass index (BMI) is used to identify weight problems. It estimates body fat based on height and weight. Your health care provider can help determine your BMI and help you achieve or maintain a healthy weight. Get regular exercise Get regular exercise. This is one of the most important things you can do for your health. Most adults should: Exercise for at least 150 minutes each week. The exercise should increase your heart rate and make you sweat (moderate-intensity exercise). Do strengthening exercises at least twice a week. This is in addition to the moderate-intensity exercise. Spend less time sitting. Even light physical activity can be beneficial. Watch cholesterol and blood lipids Have your blood tested for lipids and cholesterol at 25 years of age, then have this test every 5 years. Have your cholesterol levels checked more often if: Your lipid or cholesterol levels are high. You are older than 25 years of age. You are at high risk for heart disease. What should I know about cancer screening? Depending on your health history and family history, you may need to have cancer screening at various ages. This may include screening for: Breast cancer. Cervical cancer. Colorectal cancer. Skin cancer. Lung cancer. What should I know about heart disease, diabetes, and  high blood pressure? Blood pressure and heart disease High blood pressure causes heart disease and increases the risk of stroke. This is more likely to develop in people who have high blood pressure readings or are overweight. Have your blood pressure checked: Every 3-5 years if you are 82-75 years of age. Every year if you are 28 years old or older. Diabetes Have regular diabetes screenings. This checks your fasting blood sugar level. Have the screening done: Once every three years after age 25 if you are at a normal weight and have a low risk for diabetes. More often and at a younger age if you are overweight or have a high risk for diabetes. What should I know about preventing infection? Hepatitis B If you have a higher risk for hepatitis B, you should be screened for this virus. Talk with your health care provider to find out if you are at risk for hepatitis B infection. Hepatitis C Testing is recommended for: Everyone born from [birth year] through 1967. Anyone with known risk factors for hepatitis C. Sexually transmitted infections (STIs) Get screened for STIs, including gonorrhea and chlamydia, if: You are sexually active and are younger than 25 years of age. You are older than 25 years of age and your health care provider tells you that you are at risk for this type of infection. Your sexual activity has changed since you were last screened, and you are at increased risk for chlamydia or gonorrhea. Ask your health care provider if you are at risk. Ask your health care provider about whether you are at high risk for HIV. Your health care provider may recommend  a prescription medicine to help prevent HIV infection. If you choose to take medicine to prevent HIV, you should first get tested for HIV. You should then be tested every 3 months for as long as you are taking the medicine. Pregnancy If you are about to stop having your period (premenopausal) and you may become pregnant, seek counseling  before you get pregnant. Take 400 to 800 micrograms (mcg) of folic acid every day if you become pregnant. Ask for birth control (contraception) if you want to prevent pregnancy. Osteoporosis and menopause Osteoporosis is a disease in which the bones lose minerals and strength with aging. This can result in bone fractures. If you are 34 years old or older, or if you are at risk for osteoporosis and fractures, ask your health care provider if you should: Be screened for bone loss. Take a calcium or vitamin D supplement to lower your risk of fractures. Be given hormone replacement therapy (HRT) to treat symptoms of menopause. Follow these instructions at home: Alcohol use Do not drink alcohol if: Your health care provider tells you not to drink. You are pregnant, may be pregnant, or are planning to become pregnant. If you drink alcohol: Limit how much you have to: 0-1 drink a day. Know how much alcohol is in your drink. In the [place] one drink equals one 12 oz bottle of beer (355 mL), one 5 oz glass of wine (148 mL), or one 1 oz glass of hard liquor (44 mL). Lifestyle Do not use any products that contain nicotine or tobacco. These products include cigarettes, chewing tobacco, and vaping devices, such as e-cigarettes. If you need help quitting, ask your health care provider. Do not use street drugs. Do not share needles. Ask your health care provider for help if you need support or information about quitting drugs. General instructions Schedule regular health, dental, and eye exams. Stay current with your vaccines. Tell your health care provider if: You often feel depressed. You have ever been abused or do not feel safe at home. Summary Adopting a healthy lifestyle and getting preventive care are important in promoting health and wellness. Follow your health care provider's instructions about healthy diet, exercising, and getting tested or screened for diseases. Follow your health care  provider's instructions on monitoring your cholesterol and blood pressure. This information is not intended to replace advice given to you by your health care provider. Make sure you discuss any questions you have with your health care provider. Document Revised: 09/22/2020 Document Reviewed: 09/22/2020 Elsevier Patient Education  2024 [employer].

## 2023-10-27 ENCOUNTER — Ambulatory Visit: Payer: Self-pay | Admitting: Obstetrics and Gynecology

## 2023-10-27 DIAGNOSIS — E559 Vitamin D deficiency, unspecified: Secondary | ICD-10-CM

## 2023-10-27 DIAGNOSIS — D75839 Thrombocytosis, unspecified: Secondary | ICD-10-CM

## 2023-10-27 DIAGNOSIS — D72829 Elevated white blood cell count, unspecified: Secondary | ICD-10-CM

## 2023-10-27 LAB — COMPLETE METABOLIC PANEL WITHOUT GFR
AG Ratio: 1.3 (calc) (ref 1.0–2.5)
ALT: 14 U/L (ref 6–29)
AST: 19 U/L (ref 10–30)
Albumin: 4.5 g/dL (ref 3.6–5.1)
Alkaline phosphatase (APISO): 77 U/L (ref 31–125)
BUN: 9 mg/dL (ref 7–25)
CO2: 23 mmol/L (ref 20–32)
Calcium: 9.7 mg/dL (ref 8.6–10.2)
Chloride: 102 mmol/L (ref 98–110)
Creat: 0.76 mg/dL (ref 0.50–0.96)
Globulin: 3.4 g/dL (ref 1.9–3.7)
Glucose, Bld: 76 mg/dL (ref 65–99)
Potassium: 4 mmol/L (ref 3.5–5.3)
Sodium: 137 mmol/L (ref 135–146)
Total Bilirubin: 0.5 mg/dL (ref 0.2–1.2)
Total Protein: 7.9 g/dL (ref 6.1–8.1)

## 2023-10-27 LAB — HEMOGLOBIN A1C W/OUT EAG: Hgb A1c MFr Bld: 5.7 % — ABNORMAL HIGH (ref <5.7)

## 2023-10-27 LAB — THYROID PANEL WITH TSH
Free Thyroxine Index: 2.4 (ref 1.4–3.8)
T3 Uptake: 31 % (ref 22–35)
T4, Total: 7.8 ug/dL (ref 5.1–11.9)
TSH: 0.71 m[IU]/L

## 2023-10-27 LAB — LIPID PANEL
Cholesterol: 159 mg/dL (ref <200)
HDL: 63 mg/dL (ref >=50)
LDL Cholesterol (Calc): 80 mg/dL
Non-HDL Cholesterol (Calc): 96 mg/dL (ref <130)
Total CHOL/HDL Ratio: 2.5 (calc) (ref <5.0)
Triglycerides: 80 mg/dL (ref <150)

## 2023-10-27 LAB — HSV 2 ANTIBODY, IGG: HSV 2 Glycoprotein G Ab, IgG: <0.9 {index}

## 2023-10-27 LAB — HEPATITIS B SURFACE ANTIGEN: Hepatitis B Surface Ag: NONREACTIVE

## 2023-10-27 LAB — HIV ANTIBODY (ROUTINE TESTING W REFLEX): HIV 1&2 Ab, 4th Generation: NONREACTIVE

## 2023-10-27 LAB — VITAMIN D 25 HYDROXY (VIT D DEFICIENCY, FRACTURES): Vit D, 25-Hydroxy: 17 ng/mL — ABNORMAL LOW (ref 30–100)

## 2023-10-27 LAB — CBC
HCT: 38.2 % (ref 35.0–45.0)
Hemoglobin: 11.7 g/dL (ref 11.7–15.5)
MCH: 21.5 pg — ABNORMAL LOW (ref 27.0–33.0)
MCHC: 30.6 g/dL — ABNORMAL LOW (ref 32.0–36.0)
MCV: 70.3 fL — ABNORMAL LOW (ref 80.0–100.0)
MPV: 9.8 fL (ref 7.5–12.5)
Platelets: 413 10*3/uL — ABNORMAL HIGH (ref 140–400)
RBC: 5.43 10*6/uL — ABNORMAL HIGH (ref 3.80–5.10)
RDW: 16.4 % — ABNORMAL HIGH (ref 11.0–15.0)
WBC: 13.7 10*3/uL — ABNORMAL HIGH (ref 3.8–10.8)

## 2023-10-27 LAB — HEPATITIS C ANTIBODY: Hepatitis C Ab: NONREACTIVE

## 2023-10-27 LAB — RPR: RPR Ser Ql: NONREACTIVE

## 2023-11-07 NOTE — Progress Notes (Unsigned)
 23 y.o. 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 Single. For this telephone visit, the patient is in her home and I am in my office at the [facility].  Converted from video visit to phone visit due to patient's inability to connect.  Patient's last menstrual period was 09/30/2023 (exact date).   Results from annual exam: LSIL, +HPV, no history of abnl PAP A1c 5.7, Vit D 17, Abnl CBC  +UPT, planning for medical abortion with PP this Friday.  Birth control: planning to start NuvaRing Last mammogram: n/a Sexually active: Yes   GYN HISTORY: No sig history   OB History  Gravida Para Term Preterm AB Living  3 2 2  1 2   SAB IAB Ectopic Multiple Live Births   1  0 2    # Outcome Date GA Lbr Len/2nd Weight Sex Type Anes PTL Lv  3 Term 11/15/19 [redacted]w[redacted]d 11:10 / 00:42 6 lb 12.2 oz (3.067 kg) F Vag-Spont EPI  LIV  2 IAB           1 Term            Past Medical History:  Diagnosis Date   Asthma    Exercise & allergy related, Last inhaler use 11/11/19   Asthma    Blood transfusion without reported diagnosis    STD (sexually transmitted disease)    chlamydia treated   Past Surgical History:  Procedure Laterality Date   CESAREAN SECTION     NO PAST SURGERIES     Current Outpatient Medications on File Prior to Visit  Medication Sig Dispense Refill   acetaminophen  (TYLENOL ) 325 MG tablet Take 2 tablets (650 mg total) by mouth every 4 (four) hours as needed (for pain scale < 4). 30 tablet    benzoyl peroxide 5 % external liquid Wash affected external area daily as directed     Clindamycin-Benzoyl Per, Refr, gel Apply topically.     etonogestrel -ethinyl estradiol  (NUVARING) 0.12-0.015 MG/24HR vaginal ring Place 1 each vaginally every 28 (twenty-eight) days. Insert vaginally and leave in place for 3 consecutive weeks, then remove for 1 week. 3 each 4   ibuprofen  (ADVIL ) 600 MG tablet Take 1 tablet (600 mg total) by mouth every 6 (six) hours as needed.  30 tablet 0   loratadine (CLARITIN) 10 MG tablet Take 10 mg by mouth daily as needed for allergies.     ondansetron  (ZOFRAN  ODT) 4 MG disintegrating tablet Take 1 tablet (4 mg total) by mouth every 8 (eight) hours as needed for nausea or vomiting. 20 tablet 0   PROAIR  HFA 108 (90 BASE) MCG/ACT inhaler Inhale 1-2 puffs into the lungs every 6 (six) hours as needed for wheezing or shortness of breath.   0   triamcinolone  cream (KENALOG ) 0.1 % Apply 1 Application topically 3 (three) times daily as needed (skin rash). 160 g 0   No current facility-administered medications on file prior to visit.   Allergies  Allergen Reactions   Meloxicam Shortness Of Breath    Other reaction(s): SOB     PE There were no vitals filed for this visit. There is no height or weight on file to calculate BMI.  Physical Exam Vitals reviewed: Not performed due to phone visit.     Assessment and Plan:        LGSIL on Pap smear of cervix -     Colposcopy; Future  Reviewed HPV infection in association with cervical cancer. Reviewed ASCCP  guidelines. Recommendations for colposcopy were made based off of guidelines. I also recommend completion of Gardasil vaccine, avoidance of smoking, and use of condoms with new sexual partners to prevent progression of disease. All questions answered.    Prediabetes Discussed healthy diet and exercise  Vitamin D  insufficiency Recommend OTC MTV with at least 1000 international units Vit D.  Phone call lasted 15 min.  Vera LULLA Pa, MD

## 2023-11-08 ENCOUNTER — Telehealth: Admitting: Obstetrics and Gynecology

## 2023-11-08 ENCOUNTER — Encounter: Payer: Self-pay | Admitting: Obstetrics and Gynecology

## 2023-11-08 DIAGNOSIS — E559 Vitamin D deficiency, unspecified: Secondary | ICD-10-CM

## 2023-11-08 DIAGNOSIS — R87612 Low grade squamous intraepithelial lesion on cytologic smear of cervix (LGSIL): Secondary | ICD-10-CM | POA: Insufficient documentation

## 2023-11-08 DIAGNOSIS — R7303 Prediabetes: Secondary | ICD-10-CM

## 2023-11-08 NOTE — Patient Instructions (Signed)
 Take 600mg  ibuprofen  30 minutes prior to your appointment.

## 2023-11-29 ENCOUNTER — Ambulatory Visit: Admitting: Obstetrics and Gynecology

## 2023-12-08 ENCOUNTER — Other Ambulatory Visit (HOSPITAL_COMMUNITY)
Admission: RE | Admit: 2023-12-08 | Discharge: 2023-12-08 | Disposition: A | Discharge status: Home / Self Care | Source: Ambulatory Visit | Attending: Obstetrics and Gynecology | Admitting: Obstetrics and Gynecology

## 2023-12-08 ENCOUNTER — Ambulatory Visit (INDEPENDENT_AMBULATORY_CARE_PROVIDER_SITE_OTHER): Admitting: Obstetrics and Gynecology

## 2023-12-08 ENCOUNTER — Encounter: Payer: Self-pay | Admitting: Obstetrics and Gynecology

## 2023-12-08 VITALS — BP 102/70 | HR 69 | Wt 215.0 lb

## 2023-12-08 DIAGNOSIS — R87612 Low grade squamous intraepithelial lesion on cytologic smear of cervix (LGSIL): Secondary | ICD-10-CM | POA: Diagnosis present

## 2023-12-08 DIAGNOSIS — Z23 Encounter for immunization: Secondary | ICD-10-CM | POA: Diagnosis not present

## 2023-12-08 DIAGNOSIS — Z01812 Encounter for preprocedural laboratory examination: Secondary | ICD-10-CM | POA: Diagnosis not present

## 2023-12-08 LAB — PREGNANCY, URINE: Preg Test, Ur: NEGATIVE

## 2023-12-08 NOTE — Progress Notes (Signed)
 23 y.o. [id number] female with hx of CD x2, abnl PAP  here for colposcopy. Single.  Patient's last menstrual period was 11/27/2023 (approximate).   Results from annual exam: LSIL, +HPV, no history of abnl PAP A1c 5.7, Vit D 17, Abnl CBC  1 month s/p medical abortion at [facility] 11/13/23. Planning to start NuvaRing Gardasil: 1/3, will give #2 today   GYN HISTORY: No sig history   OB History  Gravida Para Term Preterm AB Living  4 2 2  2 2   SAB IAB Ectopic Multiple Live Births   2  0 2    # Outcome Date GA Lbr Len/2nd Weight Sex Type Anes PTL Lv  4 Term 11/15/19 [redacted]w[redacted]d 11:10 / 00:42 6 lb 12.2 oz (3.067 kg) F Vag-Spont EPI  LIV  3 IAB           2 IAB           1 Term      CS-Unspec   LIV   Past Medical History:  Diagnosis Date   Asthma    Exercise & allergy related, Last inhaler use 11/11/19   Asthma    Blood transfusion without reported diagnosis    STD (sexually transmitted disease)    chlamydia treated   Past Surgical History:  Procedure Laterality Date   CESAREAN SECTION     NO PAST SURGERIES     Current Outpatient Medications on File Prior to Visit  Medication Sig Dispense Refill   acetaminophen  (TYLENOL ) 325 MG tablet Take 2 tablets (650 mg total) by mouth every 4 (four) hours as needed (for pain scale < 4). 30 tablet    benzoyl peroxide 5 % external liquid Wash affected external area daily as directed     Clindamycin-Benzoyl Per, Refr, gel Apply topically.     etonogestrel -ethinyl estradiol  (NUVARING) 0.12-0.015 MG/24HR vaginal ring Place 1 each vaginally every 28 (twenty-eight) days. Insert vaginally and leave in place for 3 consecutive weeks, then remove for 1 week. 3 each 4   ibuprofen  (ADVIL ) 600 MG tablet Take 1 tablet (600 mg total) by mouth every 6 (six) hours as needed. 30 tablet 0   loratadine (CLARITIN) 10 MG tablet Take 10 mg by mouth daily as needed for allergies.     ondansetron  (ZOFRAN  ODT) 4 MG disintegrating tablet Take 1 tablet (4 mg total) by mouth every  8 (eight) hours as needed for nausea or vomiting. 20 tablet 0   PROAIR  HFA 108 (90 BASE) MCG/ACT inhaler Inhale 1-2 puffs into the lungs every 6 (six) hours as needed for wheezing or shortness of breath.   0   triamcinolone  cream (KENALOG ) 0.1 % Apply 1 Application topically 3 (three) times daily as needed (skin rash). 160 g 0   No current facility-administered medications on file prior to visit.   Allergies  Allergen Reactions   Meloxicam Shortness Of Breath    Other reaction(s): SOB     PE Today's Vitals   12/08/23 1002  BP: 102/70  Pulse: 69  SpO2: 99%  Weight: 215 lb (97.5 kg)   Body mass index is 36.05 kg/m.  Physical Exam Vitals reviewed. Exam conducted with a chaperone present.  Constitutional:      General: She is not in acute distress.    Appearance: Normal appearance.  HENT:     Head: Normocephalic and atraumatic.     Nose: Nose normal.  Eyes:     Extraocular Movements: Extraocular movements intact.     Conjunctiva/sclera: Conjunctivae normal.  Pulmonary:     Effort: Pulmonary effort is normal.  Genitourinary:    General: Normal vulva.     Exam position: Lithotomy position.     Vagina: Normal. No vaginal discharge.     Cervix: Normal. No cervical motion tenderness, discharge or lesion.     Uterus: Normal. Not enlarged and not tender.      Adnexa: Right adnexa normal and left adnexa normal.  Musculoskeletal:        General: Normal range of motion.     Cervical back: Normal range of motion.  Neurological:     General: No focal deficit present.     Mental Status: She is alert.  Psychiatric:        Mood and Affect: Mood normal.        Behavior: Behavior normal.     Colposcopy Procedure Consented for procedure.  Time out performed. Speculum placed in vagina.  Acetic acid 3% was applied to cervix.  Satisfactory colposcopy.  Hurricaine anesthetic spray was applied. Biopsies taken Yes.   Location(s) - 5:00, 9:00 (+AW). ECC performed.  Specimens to  pathology separately.  Monsel's applied to biopsy areas.   Good hemostasis.  Minimal EBL. No complications.  Tolerated well.    Assessment and Plan:        Pre-procedure lab exam -     Pregnancy, urine  LGSIL on Pap smear of cervix -     Surgical pathology -     Surgical pathology  Need for HPV vaccination -     HPV 9-valent vaccine,Recombinat  Abnormal PAP results reviewed. ASCCP guidelines reviewed. Consents signed. Satisfactory colposcopy, ECC and biopsy performed. Aftercare instructions provided.  Will call with results. I also recommend completion of Gardasil vaccine up to age 45yo, avoidance of smoking, and use of condoms with new sexual partners to prevent progression of disease.  RTO in 4 month for Gardasil #3.  Vera LULLA Pa, MD

## 2023-12-08 NOTE — Patient Instructions (Signed)
 It is common to have vaginal bleeding and cramping for up to 72 hours after your biopsy. Please call our office with heavy vaginal bleeding, severe abdominal pain or fever. Avoid intercourse, tampon use, douching and baths for 7 days to decrease the risk of infection.

## 2023-12-13 ENCOUNTER — Ambulatory Visit (HOSPITAL_BASED_OUTPATIENT_CLINIC_OR_DEPARTMENT_OTHER): Payer: Self-pay | Admitting: Obstetrics and Gynecology

## 2023-12-13 DIAGNOSIS — N871 Moderate cervical dysplasia: Secondary | ICD-10-CM

## 2023-12-13 HISTORY — DX: Moderate cervical dysplasia: N87.1

## 2023-12-13 LAB — SURGICAL PATHOLOGY

## 2023-12-16 ENCOUNTER — Ambulatory Visit: Admitting: Obstetrics and Gynecology

## 2023-12-19 ENCOUNTER — Ambulatory Visit: Admitting: Obstetrics and Gynecology

## 2023-12-27 ENCOUNTER — Ambulatory Visit

## 2024-01-03 ENCOUNTER — Ambulatory Visit: Admitting: Obstetrics and Gynecology

## 2024-01-31 ENCOUNTER — Ambulatory Visit: Admitting: Internal Medicine

## 2024-02-27 ENCOUNTER — Ambulatory Visit: Admitting: Obstetrics and Gynecology

## 2024-03-12 ENCOUNTER — Encounter: Payer: Self-pay | Admitting: Obstetrics and Gynecology

## 2024-04-02 ENCOUNTER — Ambulatory Visit: Admitting: Obstetrics and Gynecology

## 2024-04-02 DIAGNOSIS — Z23 Encounter for immunization: Secondary | ICD-10-CM

## 2024-04-16 ENCOUNTER — Ambulatory Visit

## 2024-04-17 ENCOUNTER — Ambulatory Visit (HOSPITAL_COMMUNITY)
Admission: EM | Admit: 2024-04-17 | Discharge: 2024-04-17 | Disposition: A | Discharge status: Home / Self Care | Attending: Emergency Medicine | Admitting: Emergency Medicine

## 2024-04-17 ENCOUNTER — Encounter (HOSPITAL_COMMUNITY): Payer: Self-pay | Admitting: *Deleted

## 2024-04-17 DIAGNOSIS — H9203 Otalgia, bilateral: Secondary | ICD-10-CM | POA: Diagnosis present

## 2024-04-17 DIAGNOSIS — J029 Acute pharyngitis, unspecified: Secondary | ICD-10-CM | POA: Diagnosis present

## 2024-04-17 LAB — POCT RAPID STREP A (OFFICE): Rapid Strep A Screen: NEGATIVE

## 2024-04-17 MED ORDER — PREDNISOLONE SODIUM PHOSPHATE 15 MG/5ML PO SOLN
ORAL | Status: AC
  Filled 2024-04-17: qty 2

## 2024-04-17 MED ORDER — PREDNISOLONE SODIUM PHOSPHATE 15 MG/5ML PO SOLN
30.0000 mg | Freq: Once | ORAL | Status: AC
  Administered 2024-04-17: 30 mg via ORAL

## 2024-04-17 NOTE — ED Triage Notes (Signed)
 Pt states that she started having sore throat, congestion, body aches, headache, cough and congestion on Saturday. She has been taking IBU, Robitussin PM.

## 2024-04-17 NOTE — ED Provider Notes (Signed)
 [facility]    CSN: [id number] Arrival date & time: 04/17/24  9075      History   Chief Complaint Chief Complaint  Patient presents with   Sore Throat   Cough   Nasal Congestion   Generalized Body Aches   Headache   Otalgia    HPI Shari Flores is a 23 y.o. female.   Patient presents to clinic over concern of sore throat, headache, body aches, mild cough, nasal congestion and rhinorrhea that started 4 days ago.  Sore throat was initially mild but now has developed into pain with talking, breathing and swallowing.  Having bilateral ear pain and fullness well.   Cough is mild per patient.  Has been taking ibuprofen  and Robitussin without much relief.  The history is provided by the patient and medical records.  Sore Throat  Cough Headache Otalgia   Past Medical History:  Diagnosis Date   Asthma    Exercise & allergy related, Last inhaler use 11/11/19   Asthma    Blood transfusion without reported diagnosis    STD (sexually transmitted disease)    chlamydia treated    Patient Active Problem List   Diagnosis Date Noted   Dysplasia of cervix, high grade CIN 2 12/13/2023   LGSIL on Pap smear of cervix 11/08/2023   Well woman exam with routine gynecological exam 10/26/2023   Encounter for counseling regarding contraception 10/26/2023   Mild persistent chronic asthma without complication 04/30/2014   Chronic asthma 04/30/2014    Past Surgical History:  Procedure Laterality Date   CESAREAN SECTION     NO PAST SURGERIES      OB History     Gravida  4   Para  2   Term  2   Preterm      AB  2   Living  2      SAB      IAB  2   Ectopic      Multiple  0   Live Births  2            Home Medications    Prior to Admission medications   Medication Sig Start Date End Date Taking? Authorizing Provider  acetaminophen  (TYLENOL ) 325 MG tablet Take 2 tablets (650 mg total) by mouth every 4 (four) hours as needed (for pain  scale < 4). 11/17/19   Rosalva Sawyer, MD  benzoyl peroxide 5 % external liquid Wash affected external area daily as directed 07/06/22   [provider]  Clindamycin-Benzoyl Per, Refr, gel Apply topically. 07/06/22   [provider]  etonogestrel -ethinyl estradiol  (NUVARING) 0.12-0.015 MG/24HR vaginal ring Place 1 each vaginally every 28 (twenty-eight) days. Insert vaginally and leave in place for 3 consecutive weeks, then remove for 1 week. 10/26/23   Hines, Vera GAILS, MD  ibuprofen  (ADVIL ) 600 MG tablet Take 1 tablet (600 mg total) by mouth every 6 (six) hours as needed. 11/17/19   Rosalva Sawyer, MD  loratadine (CLARITIN) 10 MG tablet Take 10 mg by mouth daily as needed for allergies.    [provider]  ondansetron  (ZOFRAN  ODT) 4 MG disintegrating tablet Take 1 tablet (4 mg total) by mouth every 8 (eight) hours as needed for nausea or vomiting. 05/30/20   Shepard Clinch, PA-C  PROAIR  HFA 108 (90 BASE) MCG/ACT inhaler Inhale 1-2 puffs into the lungs every 6 (six) hours as needed for wheezing or shortness of breath.  03/28/14   [provider]  triamcinolone  cream (KENALOG )  0.1 % Apply 1 Application topically 3 (three) times daily as needed (skin rash). 08/24/23   Arloa Suzen RAMAN, NP    Family History Family History  Problem Relation Age of Onset   Diabetes Maternal Grandfather    Heart attack Maternal Grandfather    Diabetes Mother    Hypothyroidism Father     Social History Social History   Tobacco Use   Smoking status: Never   Smokeless tobacco: Never  Substance Use Topics   Alcohol use: Yes    Comment: occ   Drug use: No     Allergies   Meloxicam   Review of Systems Review of Systems  Per HPI  Physical Exam Triage Vital Signs ED Triage Vitals  Encounter Vitals Group     BP 04/17/24 0956 130/80     Girls Systolic BP Percentile --      Girls Diastolic BP Percentile --      Boys Systolic BP Percentile --      Boys Diastolic BP Percentile --       Pulse Rate 04/17/24 0956 86     Resp 04/17/24 0956 18     Temp 04/17/24 0956 99.2 F (37.3 C)     Temp Source 04/17/24 0956 Oral     SpO2 04/17/24 0956 99 %     Weight --      Height --      Head Circumference --      Peak Flow --      Pain Score 04/17/24 0955 10     Pain Loc --      Pain Education --      Exclude from Growth Chart --    No data found.  Updated Vital Signs BP 130/80 (BP Location: Right Arm)   Pulse 86   Temp 99.2 F (37.3 C) (Oral)   Resp 18   LMP 04/04/2024 (Approximate)   SpO2 99%   Breastfeeding No   Visual Acuity Right Eye Distance:   Left Eye Distance:   Bilateral Distance:    Right Eye Near:   Left Eye Near:    Bilateral Near:     Physical Exam Vitals and nursing note reviewed.  Constitutional:      Appearance: Normal appearance. She is well-developed.  HENT:     Head: Normocephalic and atraumatic.     Right Ear: Tympanic membrane, ear canal and external ear normal.     Left Ear: Tympanic membrane, ear canal and external ear normal.     Nose: Congestion and rhinorrhea present.     Mouth/Throat:     Mouth: Mucous membranes are moist.     Pharynx: Uvula midline. Posterior oropharyngeal erythema present.     Tonsils: No tonsillar exudate or tonsillar abscesses. 1+ on the right. 1+ on the left.  Eyes:     Conjunctiva/sclera: Conjunctivae normal.  Cardiovascular:     Rate and Rhythm: Normal rate and regular rhythm.     Heart sounds: Normal heart sounds. No murmur heard. Pulmonary:     Effort: Pulmonary effort is normal. No respiratory distress.     Breath sounds: Normal breath sounds. No wheezing.  Skin:    General: Skin is warm and dry.  Neurological:     General: No focal deficit present.     Mental Status: She is alert and oriented to person, place, and time.  Psychiatric:        Mood and Affect: Mood normal.        Behavior: Behavior  normal.      UC Treatments / Results  Labs (all labs ordered are listed, but only  abnormal results are displayed) Labs Reviewed  CULTURE, GROUP A STREP [facility])  POCT RAPID STREP A (OFFICE)    EKG   Radiology No results found.  Procedures Procedures (including critical care time)  Medications Ordered in UC Medications  prednisoLONE (ORAPRED) 15 MG/5ML solution 30 mg (has no administration in time range)    Initial Impression / Assessment and Plan / UC Course  I have reviewed the triage vital signs and the nursing notes.  Pertinent labs & imaging results that were available during my care of the patient were reviewed by me and considered in my medical decision making (see chart for details).  Vitals in triage reviewed, patient is hemodynamically stable.  Lungs vesicular, heart with regular rate and rhythm.  Bilateral ear fullness, tympanic membranes are pearly gray bilaterally.  Congestion, rhinorrhea and postnasal drip present.  Posterior pharynx with erythema.  Tonsils without exudate uvula midline, low concern for PTA.  POC rapid strep negative. Will send for strep culture.  Discussed likelihood of viral illness, symptomatic management discussed.  One-time dose of Orapred given in clinic due to discomfort when swallowing.  Controlling oral secretions in clinic and able to speak in full sentences.  Plan of care, follow-up care return precautions given, no questions at this time.    Final Clinical Impressions(s) / UC Diagnoses   Final diagnoses:  Acute pharyngitis, unspecified etiology  Otalgia of both ears     Discharge Instructions      Rapid strep testing was negative, will send this out for culture.  In the meantime we have given you a dose of steroids in clinic to help with the throat pain and discomfort.  You can alternate between 600 mg of ibuprofen  and 500 mg of Tylenol  every 4-6 hours to help with bodyaches, fever or chills.  This is most likely a viral illness.  Viral illnesses typically last 5 to 7 days in duration.  If you develop any  new concerning symptoms or prolonged symptoms seek follow-up care.      ED Prescriptions   None    PDMP not reviewed this encounter.   Dreama Channie SAILOR, FNP 04/17/24 1104

## 2024-04-17 NOTE — Discharge Instructions (Signed)
 Rapid strep testing was negative, will send this out for culture.  In the meantime we have given you a dose of steroids in clinic to help with the throat pain and discomfort.  You can alternate between 600 mg of ibuprofen  and 500 mg of Tylenol  every 4-6 hours to help with bodyaches, fever or chills.  This is most likely a viral illness.  Viral illnesses typically last 5 to 7 days in duration.  If you develop any new concerning symptoms or prolonged symptoms seek follow-up care.

## 2024-04-18 ENCOUNTER — Ambulatory Visit: Admitting: Obstetrics and Gynecology

## 2024-04-18 ENCOUNTER — Encounter: Payer: Self-pay | Admitting: Obstetrics and Gynecology

## 2024-04-18 VITALS — BP 132/78 | HR 72 | Temp 98.1°F | Wt 221.0 lb

## 2024-04-18 DIAGNOSIS — N871 Moderate cervical dysplasia: Secondary | ICD-10-CM | POA: Diagnosis not present

## 2024-04-18 DIAGNOSIS — Z23 Encounter for immunization: Secondary | ICD-10-CM | POA: Diagnosis not present

## 2024-04-18 LAB — PREGNANCY, URINE: Preg Test, Ur: NEGATIVE

## 2024-04-18 NOTE — Progress Notes (Signed)
 23 y.o. [id number] female with hx of CD x2, CIN 2 (12/08/23 colpo) here for results discussion. Single.  Patient's last menstrual period was 04/04/2024 (approximate). Period Duration (Days): 5-7 Period Pattern: Regular Menstrual Flow: Moderate Menstrual Control: Tampon, Maxi pad Dysmenorrhea: (!) Moderate Dysmenorrhea Symptoms: Diarrhea  She reports overall doing well. She is not currently working. She is not planning for additional children at this time, however she would like to maintain fertility.  12/08/23 path: A. CERVIX, 5 O'CLOCK, BIOPSY:  - High-grade squamous intraepithelial lesion (HSIL/CIN-2).  - Transformation zone is present.   B. CERVIX, 9 O'CLOCK, BIOPSY:  - High-grade squamous intraepithelial lesion (HSIL/CIN-2).  - Transformation zone is present.   C. ENDOCERVIX, CURETTAGE:  - At least low-grade squamous intraepithelial lesion (LSIL/CIN-1);  high-grade squamous intraepithelial (HSIL) cannot be excluded.      Component Value Date/Time   DIAGPAP - Low grade squamous intraepithelial lesion (LSIL) (A) 10/26/2023 1443   HPVHIGH Positive (A) 10/26/2023 1443   ADEQPAP  10/26/2023 1443    Satisfactory for evaluation; transformation zone component PRESENT.   Gardasil: 2/3, will give #3 today   Birth control: using nuvaring on and off, currently taking a break  GYN HISTORY: No sig hx  OB History  Gravida Para Term Preterm AB Living  4 2 2  2 2   SAB IAB Ectopic Multiple Live Births   2  0 2    # Outcome Date GA Lbr Len/2nd Weight Sex Type Anes PTL Lv  4 Term 11/15/19 [redacted]w[redacted]d 11:10 / 00:42 6 lb 12.2 oz (3.067 kg) F Vag-Spont EPI  LIV  3 IAB           2 IAB           1 Term      CS-Unspec   LIV   Past Medical History:  Diagnosis Date   Asthma    Exercise & allergy related, Last inhaler use 11/11/19   Asthma    Blood transfusion without reported diagnosis    Dysplasia of cervix, high grade CIN 2 12/13/2023   STD (sexually transmitted disease)    chlamydia  treated   Past Surgical History:  Procedure Laterality Date   CESAREAN SECTION     NO PAST SURGERIES     Current Outpatient Medications on File Prior to Visit  Medication Sig Dispense Refill   acetaminophen  (TYLENOL ) 325 MG tablet Take 2 tablets (650 mg total) by mouth every 4 (four) hours as needed (for pain scale < 4). 30 tablet    benzoyl peroxide 5 % external liquid Wash affected external area daily as directed     Clindamycin-Benzoyl Per, Refr, gel Apply topically.     etonogestrel -ethinyl estradiol  (NUVARING) 0.12-0.015 MG/24HR vaginal ring Place 1 each vaginally every 28 (twenty-eight) days. Insert vaginally and leave in place for 3 consecutive weeks, then remove for 1 week. 3 each 4   ibuprofen  (ADVIL ) 600 MG tablet Take 1 tablet (600 mg total) by mouth every 6 (six) hours as needed. 30 tablet 0   loratadine (CLARITIN) 10 MG tablet Take 10 mg by mouth daily as needed for allergies.     Multiple Vitamins-Minerals (ZINC PO) Take by mouth.     PROAIR  HFA 108 (90 BASE) MCG/ACT inhaler Inhale 1-2 puffs into the lungs every 6 (six) hours as needed for wheezing or shortness of breath.   0   triamcinolone  cream (KENALOG ) 0.1 % Apply 1 Application topically 3 (three) times daily as needed (skin rash). 160 g 0  No current facility-administered medications on file prior to visit.   Allergies  Allergen Reactions   Meloxicam Shortness Of Breath    Other reaction(s): SOB      PE Today's Vitals   04/18/24 1144  BP: 132/78  Pulse: 72  Temp: 98.1 F (36.7 C)  TempSrc: Oral  SpO2: 98%  Weight: 221 lb (100.2 kg)   Body mass index is 37.06 kg/m.  Physical Exam Vitals reviewed.  Constitutional:      General: She is not in acute distress.    Appearance: Normal appearance.  HENT:     Head: Normocephalic and atraumatic.     Nose: Nose normal.  Eyes:     Extraocular Movements: Extraocular movements intact.     Conjunctiva/sclera: Conjunctivae normal.  Pulmonary:     Effort:  Pulmonary effort is normal.  Musculoskeletal:        General: Normal range of motion.     Cervical back: Normal range of motion.  Neurological:     General: No focal deficit present.     Mental Status: She is alert.  Psychiatric:        Mood and Affect: Mood normal.        Behavior: Behavior normal.       Assessment and Plan:        Dysplasia of cervix, high grade CIN 2 Assessment & Plan: Patient with CIN 2 on colposcopy. Given age of 25yo or older, we discussed that treatment with an excisional procedure is preferred.  However, observation with HPV based testing and colposcopy every 6 months for 2 years is acceptable.  We discussed treatment options including both LEEP and CKC procedures. A LEEP procedure can typically be performed under local anesthesia in office and cold knife cone excision performed under MAC anesthesia in the operating room. Given CIN 1 on ECC, I recommend proceeding with a LEEP procedure. She believed that she tolerated the colposcopy procedure well.  We discussed that this is an outpatient procedure. Reviewed that recovery is usually 2 weeks. Reviewed risks including infection, bleeding, damage to surrounding structures, and slight increased risk of preterm delivery and PPROM (as indicated).    She will receive her third dose of Gardasil today.   Orders: -     LEEP with colposcopy; Future  Need for HPV vaccination -     HPV 9-valent vaccine,Recombinat -     Pregnancy, urine    Vera LULLA Pa, MD

## 2024-04-18 NOTE — Assessment & Plan Note (Addendum)
 Patient with CIN 2 on colposcopy. Given age of 23yo or older, we discussed that treatment with an excisional procedure is preferred.  However, observation with HPV based testing and colposcopy every 6 months for 2 years is acceptable.  We discussed treatment options including both LEEP and CKC procedures. A LEEP procedure can typically be performed under local anesthesia in office and cold knife cone excision performed under MAC anesthesia in the operating room. Given CIN 1 on ECC, I recommend proceeding with a LEEP procedure. She believed that she tolerated the colposcopy procedure well.  We discussed that this is an outpatient procedure. Reviewed that recovery is usually 2 weeks. Reviewed risks including infection, bleeding, damage to surrounding structures, and slight increased risk of preterm delivery and PPROM (as indicated).    She will receive her third dose of Gardasil today.

## 2024-04-18 NOTE — Patient Instructions (Signed)
 Take 600mg  ibuprofen  30 minutes prior to your appointment.

## 2024-04-19 ENCOUNTER — Ambulatory Visit (HOSPITAL_COMMUNITY): Payer: Self-pay

## 2024-04-19 LAB — CULTURE, GROUP A STREP (THRC)

## 2024-04-23 ENCOUNTER — Encounter: Payer: Self-pay | Admitting: Obstetrics and Gynecology

## 2024-04-30 ENCOUNTER — Ambulatory Visit

## 2024-05-04 ENCOUNTER — Ambulatory Visit: Admitting: Obstetrics and Gynecology

## 2024-05-04 ENCOUNTER — Other Ambulatory Visit (HOSPITAL_COMMUNITY)
Admission: RE | Admit: 2024-05-04 | Discharge: 2024-05-04 | Disposition: A | Discharge status: Home / Self Care | Source: Ambulatory Visit | Attending: Obstetrics and Gynecology | Admitting: Obstetrics and Gynecology

## 2024-05-04 ENCOUNTER — Encounter: Payer: Self-pay | Admitting: Obstetrics and Gynecology

## 2024-05-04 ENCOUNTER — Telehealth: Payer: Self-pay | Admitting: *Deleted

## 2024-05-04 VITALS — BP 124/80 | HR 90 | Ht 65.0 in | Wt 221.0 lb

## 2024-05-04 DIAGNOSIS — N871 Moderate cervical dysplasia: Secondary | ICD-10-CM | POA: Diagnosis present

## 2024-05-04 DIAGNOSIS — N898 Other specified noninflammatory disorders of vagina: Secondary | ICD-10-CM

## 2024-05-04 LAB — PREGNANCY, URINE: Preg Test, Ur: NEGATIVE

## 2024-05-04 MED ORDER — IBUPROFEN 800 MG PO TABS: 800.0000 mg | ORAL_TABLET | Freq: Three times a day (TID) | ORAL | 0 refills | Status: AC | PRN

## 2024-05-04 NOTE — Telephone Encounter (Signed)
 Sent!

## 2024-05-04 NOTE — Patient Instructions (Signed)
 Continue pelvic rest for 2 weeks or longer if you are still spotting or cramping. This includes use of tampons, intercourse, douching, public bathing (including hot tubs, pools, the ocean).

## 2024-05-04 NOTE — Addendum Note (Signed)
 Addended by: DALLIE BOLLARD V on: 05/04/2024 01:02 PM   Modules accepted: Orders

## 2024-05-04 NOTE — Telephone Encounter (Signed)
 Patient notified. Encounter closed

## 2024-05-04 NOTE — Telephone Encounter (Signed)
 Patient left message on triage line. Patient states she was seen this morning is office, Rx for ibuprofen  was supposed to be called into [facility] on [address]. States she checked with pharmacy, no Rx on file.   S/P LEEP 05/04/24  Dr. Dallie -please advise on Rx.

## 2024-05-04 NOTE — Progress Notes (Addendum)
 05/04/2024  [id number] Hewitt Haymaker  PROCEDURE REPORT  Preop Diagnosis: CIN 2 Post op Diagnosis: Same Procedure: LEEP, Endocervical curettage   Complications: None Anesthesia: 1% lidocaine  with epinephrine 1:200,000, 20cc (lot #[id number], #2027/02).   Findings:  Endocervix with AW with global change   Estimated blood loss: Minimal <15cc   Specimens:  Cervical LEEP pinned at 12:00 Endocervical LEEP Endocervical curettage   Disposition of specimen: Pathology    PROCEDURE IN DETAIL:   Patient was consented for the procedure and timeout was performed. She was placed in the dorsal lithotomy position. A grounding pad placed on the patient. A bivalved coated speculum was placed in the patient's vagina. Colposcopy was performed with the above-noted findings.  The cervix was prepped. Local anesthesia was administered via an intracervical block using 20 ml of 1% Lidocaine  with epinephrine. The suction was turned on, and the 20x12 (white) mm loop wire on 50 Watts of blended current was used to excise the transformation zone. Right cervical margin also resected. An endocervical LEEP was performed with a rectangular wire. ECC was performed. Specimens were handed off and sent to pathology.   Roller ball electrocautery was performed around the margins and along the surgical bed. Excellent hemostasis was achieved using Monsel's solution. The speculum was removed from the vagina.   Sponge, laps, instrument count were correct x2.  Patient tolerated the procedure well.  Routine postoperative instructions given.    Vera LULLA Pa, MD Obstetrician & [facility] [place], [facility]

## 2024-05-05 LAB — SURESWAB® ADVANCED VAGINITIS,TMA
CANDIDA SPECIES: NOT DETECTED
Candida glabrata: NOT DETECTED
SURESWAB(R) ADV BACTERIAL VAGINOSIS(BV),TMA: POSITIVE — AB
TRICHOMONAS VAGINALIS (TV),TMA: NOT DETECTED

## 2024-05-07 ENCOUNTER — Ambulatory Visit: Payer: Self-pay | Admitting: Obstetrics and Gynecology

## 2024-05-07 ENCOUNTER — Encounter: Payer: Self-pay | Admitting: Obstetrics and Gynecology

## 2024-05-07 DIAGNOSIS — B9689 Other specified bacterial agents as the cause of diseases classified elsewhere: Secondary | ICD-10-CM

## 2024-05-07 MED ORDER — METRONIDAZOLE 0.75 % VA GEL: 1.0000 | Freq: Every day | VAGINAL | 0 refills | Status: AC

## 2024-05-09 LAB — SURGICAL PATHOLOGY

## 2024-05-23 ENCOUNTER — Encounter: Admitting: Obstetrics and Gynecology

## 2024-05-24 ENCOUNTER — Encounter: Admitting: Obstetrics and Gynecology

## 2024-05-29 ENCOUNTER — Encounter: Payer: Self-pay | Admitting: Obstetrics and Gynecology

## 2024-05-29 ENCOUNTER — Ambulatory Visit: Admitting: Obstetrics and Gynecology

## 2024-05-29 VITALS — BP 104/60 | HR 75 | Temp 98.0°F | Wt 218.0 lb

## 2024-05-29 DIAGNOSIS — N871 Moderate cervical dysplasia: Secondary | ICD-10-CM

## 2024-05-29 NOTE — Progress Notes (Signed)
 "  24 y.o. 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 Single.  Patient's last menstrual period was 05/17/2024 (approximate).   She reports black discharge 1wk after surgery and wants to be sure that was normal. No cramping. Still having brownish discharge from previous menstrual today.  Black discharge resolved. No vaginal odor or irritation.  05/04/24 path: A. CERVIX, LEEP:      Benign endocervical mucosa without significant diagnostic alteration.      Negative for hyperplasia, atypia or malignancy.      No squamous mucosa identified for evaluation.  B. CERVIX, RIGHT MARGIN, LEEP:      Low-grade squamous intraepithelial lesion (LSIL / CIN1).      No evidence of high-grade dysplasia.  C. ENDOCERVIX, CURETTAGE:      Endocervical glands without significant diagnostic alteration.      Negative for hyperplasia, atypia or malignancy.   Gardasil: complete Birth control: none  GYN HISTORY: CIN 1, July 2025 on colposcopy  OB History  Gravida Para Term Preterm AB Living  4 2 2  2 2   SAB IAB Ectopic Multiple Live Births   2  0 2    # Outcome Date GA Lbr Len/2nd Weight Sex Type Anes PTL Lv  4 Term 11/15/19 [redacted]w[redacted]d 11:10 / 00:42 6 lb 12.2 oz (3.067 kg) F Vag-Spont EPI  LIV  3 IAB           2 IAB           1 Term      CS-Unspec   LIV   Past Medical History:  Diagnosis Date   Asthma    Exercise & allergy related, Last inhaler use 11/11/19   Asthma    Blood transfusion without reported diagnosis    Dysplasia of cervix, high grade CIN 2 12/13/2023   STD (sexually transmitted disease)    chlamydia treated   Past Surgical History:  Procedure Laterality Date   CESAREAN SECTION     NO PAST SURGERIES     Medications Ordered Prior to Encounter[1] Allergies[2]    PE Today's Vitals   05/29/24 1420  BP: 104/60  Pulse: 75  Temp: 98 F (36.7 C)  TempSrc: Oral  SpO2: 99%  Weight: 218 lb (98.9 kg)   Body mass index  is 36.28 kg/m.  Physical Exam Vitals reviewed.  Constitutional:      General: She is not in acute distress.    Appearance: Normal appearance.  HENT:     Head: Normocephalic and atraumatic.     Nose: Nose normal.  Eyes:     Extraocular Movements: Extraocular movements intact.     Conjunctiva/sclera: Conjunctivae normal.  Pulmonary:     Effort: Pulmonary effort is normal.  Abdominal:     General: There is no distension.     Palpations: Abdomen is soft.     Tenderness: There is no abdominal tenderness.  Musculoskeletal:        General: Normal range of motion.     Cervical back: Normal range of motion.  Neurological:     General: No focal deficit present.     Mental Status: She is alert.  Psychiatric:        Mood and Affect: Mood normal.        Behavior: Behavior normal.      Assessment and Plan:        Dysplasia of cervix, high grade CIN 2 Assessment & Plan:  Patient with CIN 2 on colposcopy. Completed Gardasil 4wk s/p LEEP, final path CIN 1, ECC benign Normal recovery  Recommend 6 month interval PAP, will be due at time of annual Schedule today     20 mins  total time was spent for this patient encounter, including preparation, face-to-face counseling with the patient, coordination of care, and documentation of the encounter.  Vera LULLA Pa, MD     [1]  Current Outpatient Medications on File Prior to Visit  Medication Sig Dispense Refill   acetaminophen  (TYLENOL ) 325 MG tablet Take 2 tablets (650 mg total) by mouth every 4 (four) hours as needed (for pain scale < 4). 30 tablet    benzoyl peroxide 5 % external liquid Wash affected external area daily as directed     Clindamycin-Benzoyl Per, Refr, gel Apply topically.     etonogestrel -ethinyl estradiol  (NUVARING) 0.12-0.015 MG/24HR vaginal ring Place 1 each vaginally every 28 (twenty-eight) days. Insert vaginally and leave in place for 3 consecutive weeks, then remove for 1 week. 3 each 4   ibuprofen  (ADVIL ) 800  MG tablet Take 1 tablet (800 mg total) by mouth every 8 (eight) hours as needed for up to 42 doses. 42 tablet 0   loratadine (CLARITIN) 10 MG tablet Take 10 mg by mouth daily as needed for allergies.     Multiple Vitamins-Minerals (ZINC PO) Take by mouth.     PROAIR  HFA 108 (90 BASE) MCG/ACT inhaler Inhale 1-2 puffs into the lungs every 6 (six) hours as needed for wheezing or shortness of breath.   0   triamcinolone  cream (KENALOG ) 0.1 % Apply 1 Application topically 3 (three) times daily as needed (skin rash). 160 g 0   No current facility-administered medications on file prior to visit.  [2]  Allergies Allergen Reactions   Meloxicam Shortness Of Breath    Other reaction(s): SOB   "

## 2024-05-29 NOTE — Assessment & Plan Note (Signed)
 Patient with CIN 2 on colposcopy. Completed Gardasil 4wk s/p LEEP, final path CIN 1, ECC benign Normal recovery  Recommend 6 month interval PAP, will be due at time of annual Schedule today

## 2024-06-12 ENCOUNTER — Other Ambulatory Visit: Payer: Self-pay

## 2024-06-12 DIAGNOSIS — B9689 Other specified bacterial agents as the cause of diseases classified elsewhere: Secondary | ICD-10-CM

## 2024-06-12 MED ORDER — METRONIDAZOLE 0.75 % VA GEL: 1.0000 | Freq: Every day | VAGINAL | 0 refills | Status: AC

## 2024-06-12 NOTE — Telephone Encounter (Signed)
RX sent in pt is aware 

## 2024-06-12 NOTE — Telephone Encounter (Signed)
 Pt called in stating that she just saw the message in regards to being BV+ and having the metronidazole  sent in. She states that she never picked up the medication.   She isn't having any sx she just never treated the BV.   Please advise if the new script can be sent in for the pt to the [facility] [address] [facility] or does pt need to return for a retest?

## 2024-10-29 ENCOUNTER — Ambulatory Visit: Admitting: Obstetrics and Gynecology
